# Patient Record
Sex: Female | Born: 1965 | Race: White | Hispanic: No | Smoking: Current every day smoker
Health system: Southern US, Community
[De-identification: ages and names within clinical notes are randomized; demographics above are authoritative.]

## PROBLEM LIST (undated history)

## (undated) DIAGNOSIS — I10 Essential (primary) hypertension: Secondary | ICD-10-CM

## (undated) DIAGNOSIS — K219 Gastro-esophageal reflux disease without esophagitis: Secondary | ICD-10-CM

## (undated) DIAGNOSIS — M1990 Unspecified osteoarthritis, unspecified site: Secondary | ICD-10-CM

## (undated) HISTORY — PX: BRAIN SURGERY: SHX531

## (undated) HISTORY — DX: Unspecified osteoarthritis, unspecified site: M19.90

---

## 2002-10-14 DIAGNOSIS — I10 Essential (primary) hypertension: Secondary | ICD-10-CM | POA: Insufficient documentation

## 2008-04-12 ENCOUNTER — Emergency Department: Payer: Self-pay | Admitting: Unknown Physician Specialty

## 2008-08-25 ENCOUNTER — Emergency Department: Payer: Self-pay | Admitting: Unknown Physician Specialty

## 2009-01-30 ENCOUNTER — Emergency Department: Payer: Self-pay | Admitting: Emergency Medicine

## 2009-04-03 ENCOUNTER — Emergency Department: Payer: Self-pay | Admitting: Emergency Medicine

## 2009-05-21 ENCOUNTER — Emergency Department: Payer: Self-pay | Admitting: Emergency Medicine

## 2010-01-18 ENCOUNTER — Emergency Department: Payer: Self-pay | Admitting: Emergency Medicine

## 2010-03-09 ENCOUNTER — Emergency Department: Payer: Self-pay | Admitting: Emergency Medicine

## 2012-07-01 ENCOUNTER — Emergency Department: Payer: Self-pay | Admitting: Emergency Medicine

## 2012-07-08 ENCOUNTER — Emergency Department: Payer: Self-pay | Admitting: Emergency Medicine

## 2012-10-08 ENCOUNTER — Emergency Department: Payer: Self-pay | Admitting: Emergency Medicine

## 2012-10-08 LAB — CBC
HCT: 34.8 % — ABNORMAL LOW (ref 35.0–47.0)
HGB: 11.3 g/dL — ABNORMAL LOW (ref 12.0–16.0)
MCH: 25.5 pg — ABNORMAL LOW (ref 26.0–34.0)
MCHC: 32.6 g/dL (ref 32.0–36.0)
RDW: 17.3 % — ABNORMAL HIGH (ref 11.5–14.5)

## 2012-10-08 LAB — COMPREHENSIVE METABOLIC PANEL
Albumin: 3.3 g/dL — ABNORMAL LOW (ref 3.4–5.0)
Alkaline Phosphatase: 105 U/L (ref 50–136)
Anion Gap: 6 — ABNORMAL LOW (ref 7–16)
BUN: 6 mg/dL — ABNORMAL LOW (ref 7–18)
Calcium, Total: 8.4 mg/dL — ABNORMAL LOW (ref 8.5–10.1)
Chloride: 106 mmol/L (ref 98–107)
Creatinine: 0.58 mg/dL — ABNORMAL LOW (ref 0.60–1.30)
Glucose: 95 mg/dL (ref 65–99)
Osmolality: 275 (ref 275–301)
Total Protein: 7.3 g/dL (ref 6.4–8.2)

## 2012-10-08 LAB — URINALYSIS, COMPLETE
Blood: NEGATIVE
Glucose,UR: NEGATIVE mg/dL (ref 0–75)
Ketone: NEGATIVE
Leukocyte Esterase: NEGATIVE
Nitrite: NEGATIVE
Ph: 8 (ref 4.5–8.0)
Protein: NEGATIVE
RBC,UR: NONE SEEN /HPF (ref 0–5)
Specific Gravity: 1.005 (ref 1.003–1.030)
WBC UR: NONE SEEN /HPF (ref 0–5)

## 2012-10-08 LAB — LIPASE, BLOOD: Lipase: 99 U/L (ref 73–393)

## 2013-02-02 ENCOUNTER — Emergency Department: Payer: Self-pay | Admitting: Emergency Medicine

## 2013-02-02 LAB — BASIC METABOLIC PANEL
Anion Gap: 4 — ABNORMAL LOW (ref 7–16)
BUN: 7 mg/dL (ref 7–18)
Co2: 28 mmol/L (ref 21–32)
Creatinine: 0.52 mg/dL — ABNORMAL LOW (ref 0.60–1.30)
Glucose: 94 mg/dL (ref 65–99)
Potassium: 3.2 mmol/L — ABNORMAL LOW (ref 3.5–5.1)
Sodium: 136 mmol/L (ref 136–145)

## 2013-02-02 LAB — CBC
HCT: 32.9 % — ABNORMAL LOW (ref 35.0–47.0)
MCH: 24.5 pg — ABNORMAL LOW (ref 26.0–34.0)
Platelet: 252 10*3/uL (ref 150–440)
RBC: 4.38 10*6/uL (ref 3.80–5.20)
WBC: 7.1 10*3/uL (ref 3.6–11.0)

## 2013-02-02 LAB — TROPONIN I: Troponin-I: <0.02 ng/mL

## 2014-01-27 ENCOUNTER — Emergency Department: Payer: Self-pay | Admitting: Emergency Medicine

## 2014-03-19 ENCOUNTER — Emergency Department: Payer: Self-pay | Admitting: Emergency Medicine

## 2015-03-12 ENCOUNTER — Emergency Department: Payer: BLUE CROSS/BLUE SHIELD

## 2015-03-12 ENCOUNTER — Emergency Department
Admission: EM | Admit: 2015-03-12 | Discharge: 2015-03-13 | Disposition: A | Discharge status: Home / Self Care | Payer: BLUE CROSS/BLUE SHIELD | Attending: Emergency Medicine | Admitting: Emergency Medicine

## 2015-03-12 ENCOUNTER — Encounter: Payer: Self-pay | Admitting: Radiology

## 2015-03-12 DIAGNOSIS — R1030 Lower abdominal pain, unspecified: Secondary | ICD-10-CM

## 2015-03-12 DIAGNOSIS — K5732 Diverticulitis of large intestine without perforation or abscess without bleeding: Secondary | ICD-10-CM | POA: Insufficient documentation

## 2015-03-12 DIAGNOSIS — Z880 Allergy status to penicillin: Secondary | ICD-10-CM | POA: Insufficient documentation

## 2015-03-12 DIAGNOSIS — N390 Urinary tract infection, site not specified: Secondary | ICD-10-CM | POA: Insufficient documentation

## 2015-03-12 DIAGNOSIS — Z3202 Encounter for pregnancy test, result negative: Secondary | ICD-10-CM | POA: Insufficient documentation

## 2015-03-12 HISTORY — DX: Essential (primary) hypertension: I10

## 2015-03-12 LAB — COMPREHENSIVE METABOLIC PANEL
ALBUMIN: 3.8 g/dL (ref 3.5–5.0)
ALT: 22 U/L (ref 14–54)
AST: 33 U/L (ref 15–41)
Alkaline Phosphatase: 109 U/L (ref 38–126)
Anion gap: 8 (ref 5–15)
BILIRUBIN TOTAL: 0.8 mg/dL (ref 0.3–1.2)
BUN: 5 mg/dL — AB (ref 6–20)
CO2: 26 mmol/L (ref 22–32)
CREATININE: 0.61 mg/dL (ref 0.44–1.00)
Calcium: 8.6 mg/dL — ABNORMAL LOW (ref 8.9–10.3)
Chloride: 104 mmol/L (ref 101–111)
GFR calc Af Amer: >60 mL/min (ref >60)
Glucose, Bld: 99 mg/dL (ref 65–99)
Potassium: 3.2 mmol/L — ABNORMAL LOW (ref 3.5–5.1)
Sodium: 138 mmol/L (ref 135–145)
TOTAL PROTEIN: 7.4 g/dL (ref 6.5–8.1)

## 2015-03-12 LAB — CBC
HCT: 39.6 % (ref 35.0–47.0)
Hemoglobin: 12.9 g/dL (ref 12.0–16.0)
MCH: 26.9 pg (ref 26.0–34.0)
MCHC: 32.6 g/dL (ref 32.0–36.0)
MCV: 82.6 fL (ref 80.0–100.0)
PLATELETS: 288 10*3/uL (ref 150–440)
RBC: 4.79 MIL/uL (ref 3.80–5.20)
RDW: 15.5 % — AB (ref 11.5–14.5)
WBC: 16.2 10*3/uL — AB (ref 3.6–11.0)

## 2015-03-12 LAB — URINALYSIS COMPLETE WITH MICROSCOPIC (ARMC ONLY)
BILIRUBIN URINE: NEGATIVE
GLUCOSE, UA: NEGATIVE mg/dL
Nitrite: NEGATIVE
Protein, ur: 30 mg/dL — AB
Specific Gravity, Urine: 1.015 (ref 1.005–1.030)
pH: 6 (ref 5.0–8.0)

## 2015-03-12 LAB — LIPASE, BLOOD: Lipase: 17 U/L — ABNORMAL LOW (ref 22–51)

## 2015-03-12 LAB — PREGNANCY, URINE: PREG TEST UR: NEGATIVE

## 2015-03-12 MED ORDER — METRONIDAZOLE 500 MG PO TABS
500.0000 mg | ORAL_TABLET | Freq: Once | ORAL | Status: AC
  Administered 2015-03-13: 500 mg via ORAL
  Filled 2015-03-12: qty 1

## 2015-03-12 MED ORDER — ONDANSETRON HCL 4 MG/2ML IJ SOLN
4.0000 mg | Freq: Once | INTRAMUSCULAR | Status: AC
  Administered 2015-03-12: 4 mg via INTRAVENOUS
  Filled 2015-03-12: qty 2

## 2015-03-12 MED ORDER — MORPHINE SULFATE (PF) 4 MG/ML IV SOLN
4.0000 mg | Freq: Once | INTRAVENOUS | Status: AC
  Administered 2015-03-12: 4 mg via INTRAVENOUS
  Filled 2015-03-12: qty 1

## 2015-03-12 MED ORDER — IOHEXOL 240 MG/ML SOLN
25.0000 mL | Freq: Once | INTRAMUSCULAR | Status: AC | PRN
  Administered 2015-03-12: 25 mL via ORAL

## 2015-03-12 MED ORDER — CIPROFLOXACIN HCL 500 MG PO TABS: 500.0000 mg | ORAL_TABLET | Freq: Two times a day (BID) | ORAL | Status: AC

## 2015-03-12 MED ORDER — DEXTROSE 5 % IV SOLN
1.0000 g | Freq: Once | INTRAVENOUS | Status: AC
  Administered 2015-03-13: 1 g via INTRAVENOUS
  Filled 2015-03-12: qty 10

## 2015-03-12 MED ORDER — TRAMADOL HCL 50 MG PO TABS: 50.0000 mg | ORAL_TABLET | Freq: Four times a day (QID) | ORAL | Status: DC | PRN

## 2015-03-12 MED ORDER — CEPHALEXIN 500 MG PO CAPS: 500.0000 mg | ORAL_CAPSULE | Freq: Three times a day (TID) | ORAL | Status: DC

## 2015-03-12 MED ORDER — IOHEXOL 300 MG/ML  SOLN
100.0000 mL | Freq: Once | INTRAMUSCULAR | Status: AC | PRN
  Administered 2015-03-12: 100 mL via INTRAVENOUS

## 2015-03-12 MED ORDER — METRONIDAZOLE 500 MG PO TABS: 500.0000 mg | ORAL_TABLET | Freq: Three times a day (TID) | ORAL | Status: AC

## 2015-03-12 MED ORDER — SODIUM CHLORIDE 0.9 % IV BOLUS (SEPSIS)
1000.0000 mL | Freq: Once | INTRAVENOUS | Status: AC
  Administered 2015-03-12: 1000 mL via INTRAVENOUS

## 2015-03-12 MED ORDER — CIPROFLOXACIN HCL 500 MG PO TABS
500.0000 mg | ORAL_TABLET | Freq: Once | ORAL | Status: AC
  Administered 2015-03-13: 500 mg via ORAL
  Filled 2015-03-12: qty 1

## 2015-03-12 NOTE — ED Provider Notes (Addendum)
[facility] Emergency Department Provider Note  Time seen: 10:19 PM  I have reviewed the triage vital signs and the nursing notes.   HISTORY  Chief Complaint Abdominal Pain and Back Pain    HPI Sherry Wright is a 49 y.o. female who presents to the emergency department with lower abdominal pain since last night. According to the patient since around 11 PM last night she developed left lower quadrant abdominal pain radiating to her back. Describes the pain as moderate to severe in intensity. Denies diarrhea or constipation. Denies nausea or vomiting. Denies fever. Patient states the pain had her crippled overt tonight due to severity so she came to the emergency department for evaluation. Denies any similar pains in the past.No noted modifying factors identified per patient.     No past medical history on file.  There are no active problems to display for this patient.   No past surgical history on file.  No current outpatient prescriptions on file.  Allergies Penicillins  No family history on file.  Social History Social History  Substance Use Topics  . Smoking status: Not on file  . Smokeless tobacco: Not on file  . Alcohol Use: Not on file    Review of Systems Constitutional: Negative for fever. Cardiovascular: Negative for chest pain. Respiratory: Negative for shortness of breath. Gastrointestinal: Positive lower and left lower quadrant abdominal pain. Genitourinary: Negative for dysuria. Negative for vaginal discharge. Currently on her period. Musculoskeletal: He eats the pain radiates to her left lower back. Neurological: Negative for headache 10-point ROS otherwise negative.  ____________________________________________   PHYSICAL EXAM:  VITAL SIGNS: ED Triage Vitals  Enc Vitals Group     BP 03/12/15 1946 180/100 mmHg     Pulse Rate 03/12/15 1946 94     Resp 03/12/15 1946 18     Temp 03/12/15 1946 99.2 F (37.3 C)     Temp  Source 03/12/15 1946 Oral     SpO2 03/12/15 1946 97 %     Weight 03/12/15 1946 170 lb (77.111 kg)     Height 03/12/15 1946 5\' 6"  (1.676 m)     Head Cir --      Peak Flow --      Pain Score 03/12/15 1947 9     Pain Loc --      Pain Edu? --      Excl. in GC? --     Constitutional: Alert and oriented. Well appearing and in no distress. Eyes: Normal exam ENT   Head: Normocephalic and atraumatic   Mouth/Throat: Mucous membranes are moist. Cardiovascular: Normal rate, regular rhythm. Respiratory: Normal respiratory effort without tachypnea nor retractions. Breath sounds are clear Gastrointestinal: Soft, moderate lower abdominal and left lower quadrant tenderness to palpation. No right-sided tenderness. No rebound or guarding. No distention. No back tenderness identified. Musculoskeletal: Nontender with normal range of motion in all extremities. Neurologic:  Normal speech and language. No gross focal neurologic deficits Psychiatric: Mood and affect are normal. Speech and behavior are normal. ____________________________________________   RADIOLOGY  CT pending  ____________________________________________   INITIAL IMPRESSION / ASSESSMENT AND PLAN / ED COURSE  Pertinent labs & imaging results that were available during my care of the patient were reviewed by me and considered in my medical decision making (see chart for details).  Patient with left lower quadrant abdominal pain since last night. Vitals show temperature 99.2, mild hypertension otherwise within normal limits. Labs show a leukocytosis of 16 otherwise within normal limits.  Patient is mild to moderate left lower quadrant tenderness to palpation. Given these findings with her leukocytosis we will proceed with a CT abdomen and pelvis to help further evaluate the patient's pain. We'll treat the pain, give nausea medication, and IV hydrate. Discussed the plan of care with the patient was agreeable.  Urinalysis is  resulted showing too numerous to count white blood cells, most consistent urinary tract infection, given the degree of left lower quadrant abdominal tenderness we will still proceed with CT scan, but will begin IV antibiotics for urinary tract infection, which could likely be the source of the patient's discomfort.   CT scan results showing mild diverticulitis. Discussed with the patient, will discharge on ciprofloxacin and Flagyl, and have the patient follow with GI medicine to discuss follow-up colonoscopy.  ____________________________________________   FINAL CLINICAL IMPRESSION(S) / ED DIAGNOSES  Left lower quadrant abdominal pain UTI Diverticulitis Minna Antis, MD 03/12/15 2320  Minna Antis, MD 03/12/15 2351

## 2015-03-12 NOTE — ED Notes (Signed)
Patient reports abdominal and back pain since last pm.  +nausea, no vomiting.

## 2015-03-12 NOTE — Discharge Instructions (Signed)
You have been seen in the emergency department today for abdominal pain your results show a urinary tract infection. Please take your course of antibiotics as written. Please take your pain medication as needed, as prescribed. Please follow-up with your primary care physician as soon as possible. Return to the emergency department for any worsening pain, fever, or any other symptom personally concerning to your self.    Abdominal Pain, Women Abdominal (stomach, pelvic, or belly) pain can be caused by many things. It is important to tell your doctor:  The location of the pain.  Does it come and go or is it present all the time?  Are there things that start the pain (eating certain foods, exercise)?  Are there other symptoms associated with the pain (fever, nausea, vomiting, diarrhea)? All of this is helpful to know when trying to find the cause of the pain. CAUSES   Stomach: virus or bacteria infection, or ulcer.  Intestine: appendicitis (inflamed appendix), regional ileitis (Crohn's disease), ulcerative colitis (inflamed colon), irritable bowel syndrome, diverticulitis (inflamed diverticulum of the colon), or cancer of the stomach or intestine.  Gallbladder disease or stones in the gallbladder.  Kidney disease, kidney stones, or infection.  Pancreas infection or cancer.  Fibromyalgia (pain disorder).  Diseases of the female organs:  Uterus: fibroid (non-cancerous) tumors or infection.  Fallopian tubes: infection or tubal pregnancy.  Ovary: cysts or tumors.  Pelvic adhesions (scar tissue).  Endometriosis (uterus lining tissue growing in the pelvis and on the pelvic organs).  Pelvic congestion syndrome (female organs filling up with blood just before the menstrual period).  Pain with the menstrual period.  Pain with ovulation (producing an egg).  Pain with an IUD (intrauterine device, birth control) in the uterus.  Cancer of the female organs.  Functional pain (pain  not caused by a disease, may improve without treatment).  Psychological pain.  Depression. DIAGNOSIS  Your doctor will decide the seriousness of your pain by doing an examination.  Blood tests.  X-rays.  Ultrasound.  CT scan (computed tomography, special type of X-ray).  MRI (magnetic resonance imaging).  Cultures, for infection.  Barium enema (dye inserted in the large intestine, to better view it with X-rays).  Colonoscopy (looking in intestine with a lighted tube).  Laparoscopy (minor surgery, looking in abdomen with a lighted tube).  Major abdominal exploratory surgery (looking in abdomen with a large incision). TREATMENT  The treatment will depend on the cause of the pain.   Many cases can be observed and treated at home.  Over-the-counter medicines recommended by your caregiver.  Prescription medicine.  Antibiotics, for infection.  Birth control pills, for painful periods or for ovulation pain.  Hormone treatment, for endometriosis.  Nerve blocking injections.  Physical therapy.  Antidepressants.  Counseling with a psychologist or psychiatrist.  Minor or major surgery. HOME CARE INSTRUCTIONS   Do not take laxatives, unless directed by your caregiver.  Take over-the-counter pain medicine only if ordered by your caregiver. Do not take aspirin because it can cause an upset stomach or bleeding.  Try a clear liquid diet (broth or water) as ordered by your caregiver. Slowly move to a bland diet, as tolerated, if the pain is related to the stomach or intestine.  Have a thermometer and take your temperature several times a day, and record it.  Bed rest and sleep, if it helps the pain.  Avoid sexual intercourse, if it causes pain.  Avoid stressful situations.  Keep your follow-up appointments and tests, as your  caregiver orders.  If the pain does not go away with medicine or surgery, you may try:  Acupuncture.  Relaxation exercises (yoga,  meditation).  Group therapy.  Counseling. SEEK MEDICAL CARE IF:   You notice certain foods cause stomach pain.  Your home care treatment is not helping your pain.  You need stronger pain medicine.  You want your IUD removed.  You feel faint or lightheaded.  You develop nausea and vomiting.  You develop a rash.  You are having side effects or an allergy to your medicine. SEEK IMMEDIATE MEDICAL CARE IF:   Your pain does not go away or gets worse.  You have a fever.  Your pain is felt only in portions of the abdomen. The right side could possibly be appendicitis. The left lower portion of the abdomen could be colitis or diverticulitis.  You are passing blood in your stools (bright red or black tarry stools, with or without vomiting).  You have blood in your urine.  You develop chills, with or without a fever.  You pass out. MAKE SURE YOU:   Understand these instructions.  Will watch your condition.  Will get help right away if you are not doing well or get worse. Document Released: 03/25/2007 Document Revised: 10/12/2013 Document Reviewed: 04/14/2009 [facility] Patient Information 2015 [place], [place]. This information is not intended to replace advice given to you by your health care provider. Make sure you discuss any questions you have with your health care provider.  Urinary Tract Infection A urinary tract infection (UTI) can occur any place along the urinary tract. The tract includes the kidneys, ureters, bladder, and urethra. A type of germ called bacteria often causes a UTI. UTIs are often helped with antibiotic medicine.  HOME CARE   If given, take antibiotics as told by your doctor. Finish them even if you start to feel better.  Drink enough fluids to keep your pee (urine) clear or pale yellow.  Avoid tea, drinks with caffeine, and bubbly (carbonated) drinks.  Pee often. Avoid holding your pee in for a long time.  Pee before and after having sex  (intercourse).  Wipe from front to back after you poop (bowel movement) if you are a woman. Use each tissue only once. GET HELP RIGHT AWAY IF:   You have back pain.  You have lower belly (abdominal) pain.  You have chills.  You feel sick to your stomach (nauseous).  You throw up (vomit).  Your burning or discomfort with peeing does not go away.  You have a fever.  Your symptoms are not better in 3 days. MAKE SURE YOU:   Understand these instructions.  Will watch your condition.  Will get help right away if you are not doing well or get worse. Document Released: 11/14/2007 Document Revised: 02/20/2012 Document Reviewed: 12/27/2011 [facility] Patient Information 2015 [place], [place]. This information is not intended to replace advice given to you by your health care provider. Make sure you discuss any questions you have with your health care provider.  Diverticulitis Diverticulitis is inflammation or infection of small pouches in your colon that form when you have a condition called diverticulosis. The pouches in your colon are called diverticula. Your colon, or large intestine, is where water is absorbed and stool is formed. Complications of diverticulitis can include:  Bleeding.  Severe infection.  Severe pain.  Perforation of your colon.  Obstruction of your colon. CAUSES  Diverticulitis is caused by bacteria. Diverticulitis happens when stool becomes trapped in diverticula.  This allows bacteria to grow in the diverticula, which can lead to inflammation and infection. RISK FACTORS People with diverticulosis are at risk for diverticulitis. Eating a diet that does not include enough fiber from fruits and vegetables may make diverticulitis more likely to develop. SYMPTOMS  Symptoms of diverticulitis may include:  Abdominal pain and tenderness. The pain is normally located on the left side of the abdomen, but may occur in other areas.  Fever and  chills.  Bloating.  Cramping.  Nausea.  Vomiting.  Constipation.  Diarrhea.  Blood in your stool. DIAGNOSIS  Your health care provider will ask you about your medical history and do a physical exam. You may need to have tests done because many medical conditions can cause the same symptoms as diverticulitis. Tests may include:  Blood tests.  Urine tests.  Imaging tests of the abdomen, including X-rays and CT scans. When your condition is under control, your health care provider may recommend that you have a colonoscopy. A colonoscopy can show how severe your diverticula are and whether something else is causing your symptoms. TREATMENT  Most cases of diverticulitis are mild and can be treated at home. Treatment may include:  Taking over-the-counter pain medicines.  Following a clear liquid diet.  Taking antibiotic medicines by mouth for 7-10 days. More severe cases may be treated at a hospital. Treatment may include:  Not eating or drinking.  Taking prescription pain medicine.  Receiving antibiotic medicines through an IV tube.  Receiving fluids and nutrition through an IV tube.  Surgery. HOME CARE INSTRUCTIONS   Follow your health care provider's instructions carefully.  Follow a full liquid diet or other diet as directed by your health care provider. After your symptoms improve, your health care provider may tell you to change your diet. He or she may recommend you eat a high-fiber diet. Fruits and vegetables are good sources of fiber. Fiber makes it easier to pass stool.  Take fiber supplements or probiotics as directed by your health care provider.  Only take medicines as directed by your health care provider.  Keep all your follow-up appointments. SEEK MEDICAL CARE IF:   Your pain does not improve.  You have a hard time eating food.  Your bowel movements do not return to normal. SEEK IMMEDIATE MEDICAL CARE IF:   Your pain becomes worse.  Your  symptoms do not get better.  Your symptoms suddenly get worse.  You have a fever.  You have repeated vomiting.  You have bloody or black, tarry stools. MAKE SURE YOU:   Understand these instructions.  Will watch your condition.  Will get help right away if you are not doing well or get worse. Document Released: 03/07/2005 Document Revised: 06/02/2013 Document Reviewed: 04/22/2013 [facility] Patient Information 2015 [place], [place]. This information is not intended to replace advice given to you by your health care provider. Make sure you discuss any questions you have with your health care provider.

## 2015-03-13 MED ORDER — MORPHINE SULFATE (PF) 4 MG/ML IV SOLN
4.0000 mg | Freq: Once | INTRAVENOUS | Status: AC
  Administered 2015-03-13: 4 mg via INTRAVENOUS
  Filled 2015-03-13: qty 1

## 2015-05-25 ENCOUNTER — Emergency Department
Admission: EM | Admit: 2015-05-25 | Discharge: 2015-05-25 | Disposition: A | Discharge status: Home / Self Care | Payer: Self-pay | Attending: Emergency Medicine | Admitting: Emergency Medicine

## 2015-05-25 ENCOUNTER — Emergency Department: Payer: Self-pay

## 2015-05-25 DIAGNOSIS — F17210 Nicotine dependence, cigarettes, uncomplicated: Secondary | ICD-10-CM | POA: Insufficient documentation

## 2015-05-25 DIAGNOSIS — Z79899 Other long term (current) drug therapy: Secondary | ICD-10-CM | POA: Insufficient documentation

## 2015-05-25 DIAGNOSIS — I119 Hypertensive heart disease without heart failure: Secondary | ICD-10-CM | POA: Insufficient documentation

## 2015-05-25 DIAGNOSIS — Z880 Allergy status to penicillin: Secondary | ICD-10-CM | POA: Insufficient documentation

## 2015-05-25 DIAGNOSIS — I10 Essential (primary) hypertension: Secondary | ICD-10-CM

## 2015-05-25 DIAGNOSIS — F419 Anxiety disorder, unspecified: Secondary | ICD-10-CM | POA: Insufficient documentation

## 2015-05-25 DIAGNOSIS — R110 Nausea: Secondary | ICD-10-CM | POA: Insufficient documentation

## 2015-05-25 DIAGNOSIS — H538 Other visual disturbances: Secondary | ICD-10-CM | POA: Insufficient documentation

## 2015-05-25 LAB — COMPREHENSIVE METABOLIC PANEL
ALT: 12 U/L — ABNORMAL LOW (ref 14–54)
ANION GAP: 6 (ref 5–15)
AST: 18 U/L (ref 15–41)
Albumin: 3.5 g/dL (ref 3.5–5.0)
Alkaline Phosphatase: 78 U/L (ref 38–126)
BUN: 8 mg/dL (ref 6–20)
CHLORIDE: 106 mmol/L (ref 101–111)
CO2: 25 mmol/L (ref 22–32)
Calcium: 8.6 mg/dL — ABNORMAL LOW (ref 8.9–10.3)
Creatinine, Ser: 0.43 mg/dL — ABNORMAL LOW (ref 0.44–1.00)
Glucose, Bld: 89 mg/dL (ref 65–99)
POTASSIUM: 3.7 mmol/L (ref 3.5–5.1)
Sodium: 137 mmol/L (ref 135–145)
TOTAL PROTEIN: 7 g/dL (ref 6.5–8.1)
Total Bilirubin: 0.5 mg/dL (ref 0.3–1.2)

## 2015-05-25 LAB — URINALYSIS COMPLETE WITH MICROSCOPIC (ARMC ONLY)
BILIRUBIN URINE: NEGATIVE
Bacteria, UA: NONE SEEN
Glucose, UA: NEGATIVE mg/dL
HGB URINE DIPSTICK: NEGATIVE
KETONES UR: NEGATIVE mg/dL
LEUKOCYTES UA: NEGATIVE
NITRITE: NEGATIVE
Protein, ur: NEGATIVE mg/dL
RBC / HPF: NONE SEEN RBC/hpf (ref 0–5)
SPECIFIC GRAVITY, URINE: 1.001 — AB (ref 1.005–1.030)
pH: 6 (ref 5.0–8.0)

## 2015-05-25 LAB — CBC
HCT: 37.3 % (ref 35.0–47.0)
Hemoglobin: 12.1 g/dL (ref 12.0–16.0)
MCH: 26.7 pg (ref 26.0–34.0)
MCHC: 32.3 g/dL (ref 32.0–36.0)
MCV: 82.8 fL (ref 80.0–100.0)
PLATELETS: 279 10*3/uL (ref 150–440)
RBC: 4.51 MIL/uL (ref 3.80–5.20)
RDW: 16.9 % — AB (ref 11.5–14.5)
WBC: 7.9 10*3/uL (ref 3.6–11.0)

## 2015-05-25 LAB — TROPONIN I

## 2015-05-25 MED ORDER — METOPROLOL TARTRATE 25 MG PO TABS
25.0000 mg | ORAL_TABLET | Freq: Once | ORAL | Status: AC
  Administered 2015-05-25: 25 mg via ORAL
  Filled 2015-05-25: qty 1

## 2015-05-25 MED ORDER — HYDROCHLOROTHIAZIDE 25 MG PO TABS: 25.0000 mg | ORAL_TABLET | Freq: Every day | ORAL | Status: DC

## 2015-05-25 MED ORDER — METOPROLOL TARTRATE 25 MG PO TABS: 25.0000 mg | ORAL_TABLET | Freq: Every day | ORAL | Status: DC

## 2015-05-25 NOTE — Discharge Instructions (Signed)
Please make an appointment with your primary care doctor at the [facility] clinic for further evaluation of your high blood pressure. Please take both the blood pressure medications and record your blood pressure each morning approximately 1-2 hours after taking the medications. Please bring the record with you to your primary care doctor's office. Please also record any signs or symptoms that you have throughout the day.   Please return to the emergency department if you develop severe headache, numbness tingling or weakness, chest pain, shortness breath, fainting, or any other symptoms concerning to you.

## 2015-05-25 NOTE — ED Notes (Signed)
Pt given information about medical manegement and RX filling

## 2015-05-25 NOTE — ED Notes (Signed)
Pt c/o neck and head pain with dizziness for the past couple days, pt is hypertensive in triage and states she has a hx of HTN but does not take medication because she does not have insurance

## 2015-05-25 NOTE — ED Provider Notes (Signed)
[facility] Emergency Department Provider Note  ____________________________________________  Time seen: Approximately 8:14 AM  I have reviewed the triage vital signs and the nursing notes.   HISTORY  Chief Complaint Neck Pain and Dizziness    HPI Sherry Wright is a 49 y.o. female with a history of untreated hypertension due to medication noncompliance presenting with 3 days of nausea, "a nervous feeling," and visual changes. She states that she was previously on 2 antihypertensives which she has not taken due to insurance complications. For the past 3 days, she has woken up every day with blurred vision and seeing black spots, a nervous feeling from head to toe, and nausea without vomiting. Usually the symptoms resolve with eating oatmeal. Today, she did not improve so she came to the emergency department for further evaluation. She has had some mild headaches, and upper neck discomfort without trauma. She denies any chest pain, chest tightness, or chest pressure. She has had chronic "indigestion," which is unchanged.   Past Medical History  Diagnosis Date  . Hypertension     There are no active problems to display for this patient.   Past Surgical History  Procedure Laterality Date  . Brain surgery      Current Outpatient Rx  Name  Route  Sig  Dispense  Refill  . acetaminophen (TYLENOL) 500 MG tablet   Oral   Take 500 mg by mouth every 6 (six) hours as needed.         . hydrochlorothiazide (HYDRODIURIL) 25 MG tablet   Oral   Take 1 tablet (25 mg total) by mouth daily.   30 tablet   0   . metoprolol tartrate (LOPRESSOR) 25 MG tablet   Oral   Take 1 tablet (25 mg total) by mouth daily.   30 tablet   0   . traMADol (ULTRAM) 50 MG tablet   Oral   Take 1 tablet (50 mg total) by mouth every 6 (six) hours as needed.   20 tablet   0     Allergies Penicillins  No family history on file.  Social History Social History  Substance Use  Topics  . Smoking status: Current Every Day Smoker    Types: Cigarettes  . Smokeless tobacco: None  . Alcohol Use: No    Review of Systems Constitutional: No fever/chills. No lightheadedness or syncope. Eyes: Positive intermittent blurred vision with occasional black spots. No eye discharge. ENT: No sore throat. Cardiovascular: Denies chest pain, pressure, tightness. He does endorse palpitations associated with her nervous feeling. Respiratory: Denies shortness of breath.  No cough. Gastrointestinal: No abdominal pain.  Positive nausea, no vomiting.  No diarrhea.  No constipation. Genitourinary: Negative for dysuria. Musculoskeletal: Negative for back pain. Skin: Negative for rash. Neurological: Negative for headaches, focal weakness or numbness. Psychiatric:Sensation of anxiety.  10-point ROS otherwise negative.  ____________________________________________   PHYSICAL EXAM:  VITAL SIGNS: ED Triage Vitals  Enc Vitals Group     BP 05/25/15 0747 231/104 mmHg     Pulse Rate 05/25/15 0747 81     Resp 05/25/15 0747 18     Temp 05/25/15 0750 97.5 F (36.4 C)     Temp Source 05/25/15 0750 Oral     SpO2 05/25/15 0747 97 %     Weight 05/25/15 0747 170 lb (77.111 kg)     Height 05/25/15 0747 5\' 6"  (1.676 m)     Head Cir --      Peak Flow --  Pain Score 05/25/15 0750 7     Pain Loc --      Pain Edu? --      Excl. in GC? --     Constitutional: Alert and oriented. Well appearing and in no acute distress. Answer question appropriately. Eyes: Conjunctivae are normal.  EOMI. no scleral icterus. Head: Atraumatic. Nose: No congestion/rhinnorhea. Mouth/Throat: Mucous membranes are moist.  Neck: No stridor.  Supple.  JVD. Cardiovascular: Normal rate, regular rhythm. Holosystolic murmur without rubs or gallops.  Respiratory: Normal respiratory effort.  No retractions. Lungs CTAB.  No wheezes, rales or ronchi. Gastrointestinal: Soft and nontender. No distention. No peritoneal  signs. Musculoskeletal: No LE edema. No palpable cords or tenderness to palpation in the calves. No Homans sign. Neurologic: Alert and oriented 3. Speech is clear. Naming and repetition are intact. Face and smile symmetric. No pronator drift. 5 out of 5 grip, biceps, triceps, hip flexors, plantar flexion and dorsiflexion. Normal sensation to light touch in the bilateral upper and lower extremities, and face. Normal heel-to-shin. Skin:  Skin is warm, dry and intact. No rash noted. Psychiatric: Mood and affect are normal. Speech and behavior are normal.  Normal judgement.  ____________________________________________   LABS (all labs ordered are listed, but only abnormal results are displayed)  Labs Reviewed  CBC - Abnormal; Notable for the following:    RDW 16.9 (*)    All other components within normal limits  COMPREHENSIVE METABOLIC PANEL - Abnormal; Notable for the following:    Creatinine, Ser 0.43 (*)    Calcium 8.6 (*)    ALT 12 (*)    All other components within normal limits  URINALYSIS COMPLETEWITH MICROSCOPIC ([facility] ONLY) - Abnormal; Notable for the following:    Color, Urine COLORLESS (*)    APPearance CLEAR (*)    Specific Gravity, Urine 1.001 (*)    Squamous Epithelial / LPF 0-5 (*)    All other components within normal limits  TROPONIN I   ____________________________________________  EKG  ED ECG REPORT I, Rockne Menghini, the attending physician, personally viewed and interpreted this ECG.   Date: 05/25/2015  EKG Time: 802  Rate: 74  Rhythm: normal sinus rhythm  Axis: Normal  Intervals:first-degree A-V block   ST&T Change: No ST elevation.  ____________________________________________  RADIOLOGY  Dg Chest 2 View  05/25/2015  CLINICAL DATA:  49 year old female with dizziness, hypertension, headache, visual changes. Smoker. Initial encounter. EXAM: CHEST  2 VIEW COMPARISON:  02/02/2013 and earlier. FINDINGS: Stable large lung volumes. Mild  cardiomegaly is stable since 2014. Other mediastinal contours are within normal limits. Visualized tracheal air column is within normal limits. No pneumothorax, pulmonary edema, pleural effusion or acute pulmonary opacity. Coarse bilateral interstitial markings are stable. No acute osseous abnormality identified. IMPRESSION: Chronic hyperinflation and mild cardiomegaly. No acute cardiopulmonary abnormality. Electronically Signed   By: Odessa Fleming M.D.   On: 05/25/2015 08:52    ____________________________________________   PROCEDURES  Procedure(s) performed: None  Critical Care performed: No ____________________________________________   INITIAL IMPRESSION / ASSESSMENT AND PLAN / ED COURSE  Pertinent labs & imaging results that were available during my care of the patient were reviewed by me and considered in my medical decision making (see chart for details).  49 y.o. female with untreated hypertension presenting with significant hypertension with a blood pressure 231/104 with associated nervous feeling, nausea, and visual changes. The patient has no focal findings on her neurologic exam, acute CVA is very unlikely. I will treat her blood pressure, as well  as get basic labs to evaluate for atypical ACS symptoms. She is likely having symptomatic hypertension. If the patient has a reassuring workup and is feeling better, and to speed discharge home with follow-up with her PMD at the [place] clinic.  ----------------------------------------- 9:13 AM on 05/25/2015 -----------------------------------------  The patient's symptoms have significantly improved and her blood pressure is 196/102. The patient's EKG does not show any ischemic changes, her creatinine is normal and she is not having any proteinuria, her electrolytes are normal, and her troponin is negative. She has mild cardiomegaly on her chest x-ray and I have told her about this and recommended outpatient follow-up echocardiogram for  further evaluation. She will make a follow-up appointment with her primary care doctor at the [facility] and I will discharge her home with HCTZ and metoprolol. She understands return precautions as well as follow-up instructions.  ____________________________________________  FINAL CLINICAL IMPRESSION(S) / ED DIAGNOSES  Final diagnoses:  Essential hypertension  Blurred vision  Nausea without vomiting  Cardiomegaly - hypertensive      NEW MEDICATIONS STARTED DURING THIS VISIT:  New Prescriptions   HYDROCHLOROTHIAZIDE (HYDRODIURIL) 25 MG TABLET    Take 1 tablet (25 mg total) by mouth daily.   METOPROLOL TARTRATE (LOPRESSOR) 25 MG TABLET    Take 1 tablet (25 mg total) by mouth daily.     Rockne Menghini, MD 05/25/15 [phone]

## 2015-05-25 NOTE — ED Notes (Signed)
MD at bedside. 

## 2015-05-25 NOTE — ED Notes (Signed)
Pt states she has felt "nervous" every morning since Monday but today the nerves felt worse and would not go away, also states black spots and indigestion, states hx of HTN and present neck/ upper back pain, denies any chest pain, nausea and vomiting or SOB, pt awake and alert

## 2015-08-02 ENCOUNTER — Emergency Department
Admission: EM | Admit: 2015-08-02 | Discharge: 2015-08-03 | Disposition: A | Discharge status: Home / Self Care | Payer: Worker's Compensation | Attending: Emergency Medicine | Admitting: Emergency Medicine

## 2015-08-02 DIAGNOSIS — S5011XA Contusion of right forearm, initial encounter: Secondary | ICD-10-CM | POA: Insufficient documentation

## 2015-08-02 DIAGNOSIS — S60221A Contusion of right hand, initial encounter: Secondary | ICD-10-CM | POA: Insufficient documentation

## 2015-08-02 DIAGNOSIS — Z880 Allergy status to penicillin: Secondary | ICD-10-CM | POA: Insufficient documentation

## 2015-08-02 DIAGNOSIS — Y9389 Activity, other specified: Secondary | ICD-10-CM | POA: Insufficient documentation

## 2015-08-02 DIAGNOSIS — S0083XA Contusion of other part of head, initial encounter: Secondary | ICD-10-CM | POA: Insufficient documentation

## 2015-08-02 DIAGNOSIS — I10 Essential (primary) hypertension: Secondary | ICD-10-CM | POA: Diagnosis not present

## 2015-08-02 DIAGNOSIS — F17210 Nicotine dependence, cigarettes, uncomplicated: Secondary | ICD-10-CM | POA: Insufficient documentation

## 2015-08-02 DIAGNOSIS — W01198A Fall on same level from slipping, tripping and stumbling with subsequent striking against other object, initial encounter: Secondary | ICD-10-CM | POA: Insufficient documentation

## 2015-08-02 DIAGNOSIS — S8002XA Contusion of left knee, initial encounter: Secondary | ICD-10-CM | POA: Insufficient documentation

## 2015-08-02 DIAGNOSIS — Z79899 Other long term (current) drug therapy: Secondary | ICD-10-CM | POA: Diagnosis not present

## 2015-08-02 DIAGNOSIS — Y990 Civilian activity done for income or pay: Secondary | ICD-10-CM | POA: Insufficient documentation

## 2015-08-02 DIAGNOSIS — S6991XA Unspecified injury of right wrist, hand and finger(s), initial encounter: Secondary | ICD-10-CM | POA: Diagnosis present

## 2015-08-02 DIAGNOSIS — Y9289 Other specified places as the place of occurrence of the external cause: Secondary | ICD-10-CM | POA: Diagnosis not present

## 2015-08-02 NOTE — ED Notes (Signed)
Pt arrived to ED with c/o right hand pain, bilateral knee pain after fall on Friday. Pt reports "slip on plastic and fell forward landing on both knees onto cement floor". + bruising right thumb and wrist noted. Pt c/o pain in bilateral knees, right wrist and thumb.

## 2015-08-02 NOTE — ED Notes (Signed)
Workers comp completed 

## 2015-08-02 NOTE — ED Notes (Signed)
Pt c/o right wrist and thumb pain from a mechanical fall at work. Pt denies hitting head. Pt A&O. No deformity noted

## 2015-08-02 NOTE — ED Provider Notes (Signed)
[facility] Emergency Department Provider Note  ____________________________________________  Time seen: Approximately 11:21 PM  I have reviewed the triage vital signs and the nursing notes.   HISTORY  Chief Complaint Hand Pain    HPI Sherry Wright is a 50 y.o. female, NAD, presents to the emergency department after falling at work today. States she slipped on plastic and fell forward. Landed on both knees and caught herself with her right hand. Also notes she hit her chin during the fall but denies laceration or open wounds.  Denies LOC, chest pain, shortness of breath. His bruising about the right hand and left knee. Tenderness about the right thumb and left knee. As been able to ambulate and has full range of motion of all joints.   Past Medical History  Diagnosis Date  . Hypertension     There are no active problems to display for this patient.   Past Surgical History  Procedure Laterality Date  . Brain surgery      Current Outpatient Rx  Name  Route  Sig  Dispense  Refill  . acetaminophen (TYLENOL) 500 MG tablet   Oral   Take 500 mg by mouth every 6 (six) hours as needed.         . hydrochlorothiazide (HYDRODIURIL) 25 MG tablet   Oral   Take 1 tablet (25 mg total) by mouth daily.   30 tablet   0   . metoprolol tartrate (LOPRESSOR) 25 MG tablet   Oral   Take 1 tablet (25 mg total) by mouth daily.   30 tablet   0   . traMADol (ULTRAM) 50 MG tablet   Oral   Take 1 tablet (50 mg total) by mouth every 6 (six) hours as needed.   20 tablet   0     Allergies Penicillins  History reviewed. No pertinent family history.  Social History Social History  Substance Use Topics  . Smoking status: Current Every Day Smoker    Types: Cigarettes  . Smokeless tobacco: None  . Alcohol Use: No     Review of Systems  Constitutional: No fever/chills Eyes: No visual changes. No discharge ENT: No loose teeth Cardiovascular: No chest  pain. Respiratory: No shortness of breath. No wheezing.  Gastrointestinal: No abdominal pain.  No nausea, vomiting.   Musculoskeletal: Positive for right hand, left knee pain. Negative for back pain.  Skin: Positive bruising right forearm, right hand and left knee area and Negative for rash, lacerations, open wounds. Neurological: Negative for headaches, focal weakness or numbness. No LOC   10-point ROS otherwise negative.  ____________________________________________   PHYSICAL EXAM:  VITAL SIGNS: ED Triage Vitals  Enc Vitals Group     BP 08/02/15 2116 220/108 mmHg     Pulse Rate 08/02/15 2116 75     Resp 08/02/15 2116 20     Temp 08/02/15 2116 98.5 F (36.9 C)     Temp Source 08/02/15 2116 Oral     SpO2 08/02/15 2116 97 %     Weight 08/02/15 2116 170 lb (77.111 kg)     Height 08/02/15 2116 5\' 6"  (1.676 m)     Head Cir --      Peak Flow --      Pain Score 08/02/15 2128 5     Pain Loc --      Pain Edu? --      Excl. in GC? --     Constitutional: Alert and oriented. Well appearing and in  no acute distress. Eyes: Conjunctivae are normal. PERRL. EOMI without pain.  Head: Atraumatic. Neck: No cervical spine tenderness to palpation. Supple with full range of motion. Hematological/Lymphatic/Immunilogical: No cervical lymphadenopathy. Cardiovascular: Normal rate, regular rhythm. Normal S1 and S2.  Good peripheral circulation. Respiratory: Normal respiratory effort without tachypnea or retractions. Lungs CTAB. Musculoskeletal: Tender to palpation over the left anterior knee. No laxity or effusion noted. Tender to palpation over the right base of the thumb but with full range of motion. No crepitus to manipulation of the joint.  Neurologic:  Normal speech and language. No gross focal neurologic deficits are appreciated.  Skin:  Yellow bruising about the anterior left knee, left lower chin. Erythematous, blue ecchymosis noted about the distal right forearm. Skin is warm, dry and  intact. No rash, lesions noted. Psychiatric: Mood and affect are normal. Speech and behavior are normal. Patient exhibits appropriate insight and judgement.   ____________________________________________   LABS  None  ____________________________________________  EKG  None ____________________________________________  RADIOLOGY I have personally viewed and evaluated these images (plain radiographs) as part of my medical decision making, as well as reviewing the written report by the radiologist.  Dg Knee Complete 4 Views Left  08/03/2015  CLINICAL DATA:  Acute onset of bilateral knee pain status post fall. Initial encounter. EXAM: LEFT KNEE - COMPLETE 4+ VIEW COMPARISON:  None. FINDINGS: There is no evidence of fracture or dislocation. The joint spaces are preserved. No significant degenerative change is seen; the patellofemoral joint is grossly unremarkable in appearance. No significant joint effusion is seen. The visualized soft tissues are normal in appearance. IMPRESSION: No evidence of fracture or dislocation. Electronically Signed   By: Roanna Raider M.D.   On: 08/03/2015 00:48   Dg Hand Complete Right  08/03/2015  CLINICAL DATA:  Right hand pain after fall 4 days prior. Slip on plastic fall forward landing on cement floor. Bruising about the right thumb and wrist. EXAM: RIGHT HAND - COMPLETE 3+ VIEW COMPARISON:  None. FINDINGS: No acute fracture or dislocation. 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 No erosions of the digits. Mild soft tissue edema about the wrist. IMPRESSION: 1. No acute fracture or dislocation. 2. 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 Findings may reflect rheumatoid arthritis, other forms of inflammatory arthropathies or septic arthritis are also considered. Electronically Signed   By: Rubye Oaks M.D.   On:  08/03/2015 00:49    ____________________________________________    PROCEDURES  Procedure(s) performed: None    Medications - No data to display   ____________________________________________   INITIAL IMPRESSION / ASSESSMENT AND PLAN / ED COURSE  Pertinent imaging results that were available during my care of the patient were reviewed by me and considered in my medical decision making (see chart for details).  Patient's diagnosis is consistent with contusions of the right hand, left knee, shin. Patient will be discharged home with instructions to apply ice to the effected areas 20 minutes 3-4 times daily. May take over-the-counter Tylenol or ibuprofen as needed for pain. Patient is to follow up with [facility] if symptoms persist past this treatment course. Patient is given ED precautions to return to the ED for any worsening or new symptoms.  ____________________________________________  FINAL CLINICAL IMPRESSION(S) / ED DIAGNOSES  Final diagnoses:  Contusion of right hand, initial encounter  Contusion of left knee, initial encounter  Contusion of chin, initial encounter  NEW MEDICATIONS STARTED DURING THIS VISIT:  New Prescriptions   No medications on file         Hope Pigeon, PA-C 08/03/15 0052  Rockne Menghini, MD 08/03/15 [phone]

## 2015-08-03 ENCOUNTER — Emergency Department: Payer: Worker's Compensation

## 2015-08-03 NOTE — Discharge Instructions (Signed)
Contusion °A contusion is a deep bruise. Contusions are the result of a blunt injury to tissues and muscle fibers under the skin. The injury causes bleeding under the skin. The skin overlying the contusion may turn blue, purple, or yellow. Minor injuries will give you a painless contusion, but more severe contusions may stay painful and swollen for a few weeks.  °CAUSES  °This condition is usually caused by a blow, trauma, or direct force to an area of the body. °SYMPTOMS  °Symptoms of this condition include: °· Swelling of the injured area. °· Pain and tenderness in the injured area. °· Discoloration. The area may have redness and then turn blue, purple, or yellow. °DIAGNOSIS  °This condition is diagnosed based on a physical exam and medical history. An X-ray, CT scan, or MRI may be needed to determine if there are any associated injuries, such as broken bones (fractures). °TREATMENT  °Specific treatment for this condition depends on what area of the body was injured. In general, the best treatment for a contusion is resting, icing, applying pressure to (compression), and elevating the injured area. This is often called the RICE strategy. Over-the-counter anti-inflammatory medicines may also be recommended for pain control.  °HOME CARE INSTRUCTIONS  °· Rest the injured area. °· If directed, apply ice to the injured area: °· Put ice in a plastic bag. °· Place a towel between your skin and the bag. °· Leave the ice on for 20 minutes, 2-3 times per day. °· If directed, apply light compression to the injured area using an elastic bandage. Make sure the bandage is not wrapped too tightly. Remove and reapply the bandage as directed by your health care provider. °· If possible, raise (elevate) the injured area above the level of your heart while you are sitting or lying down. °· Take over-the-counter and prescription medicines only as told by your health care provider. °SEEK MEDICAL CARE IF: °· Your symptoms do not  improve after several days of treatment. °· Your symptoms get worse. °· You have difficulty moving the injured area. °SEEK IMMEDIATE MEDICAL CARE IF:  °· You have severe pain. °· You have numbness in a hand or foot. °· Your hand or foot turns pale or cold. °  °This information is not intended to replace advice given to you by your health care provider. Make sure you discuss any questions you have with your health care provider. °  °Document Released: 03/07/2005 Document Revised: 02/16/2015 Document Reviewed: 10/13/2014 °Elsevier Interactive Patient Education ©2016 [employer] ° °Cryotherapy °Cryotherapy means treatment with cold. Ice or gel packs can be used to reduce both pain and swelling. Ice is the most helpful within the first 24 to 48 hours after an injury or flare-up from overusing a muscle or joint. Sprains, strains, spasms, burning pain, shooting pain, and aches can all be eased with ice. Ice can also be used when recovering from surgery. Ice is effective, has very few side effects, and is safe for most people to use. °PRECAUTIONS  °Ice is not a safe treatment option for people with: °· Raynaud phenomenon. This is a condition affecting small blood vessels in the extremities. Exposure to cold may cause your problems to return. °· Cold hypersensitivity. There are many forms of cold hypersensitivity, including: °¨ Cold urticaria. Red, itchy hives appear on the skin when the tissues begin to warm after being iced. °¨ Cold erythema. This is a red, itchy rash caused by exposure to cold. °¨ Cold hemoglobinuria. Red blood cells   break down when the tissues begin to warm after being iced. The hemoglobin that carry oxygen are passed into the urine because they cannot combine with blood proteins fast enough. °· Numbness or altered sensitivity in the area being iced. °If you have any of the following conditions, do not use ice until you have discussed cryotherapy with your caregiver: °· Heart conditions, such as  arrhythmia, angina, or chronic heart disease. °· High blood pressure. °· Healing wounds or open skin in the area being iced. °· Current infections. °· Rheumatoid arthritis. °· Poor circulation. °· Diabetes. °Ice slows the blood flow in the region it is applied. This is beneficial when trying to stop inflamed tissues from spreading irritating chemicals to surrounding tissues. However, if you expose your skin to cold temperatures for too long or without the proper protection, you can damage your skin or nerves. Watch for signs of skin damage due to cold. °HOME CARE INSTRUCTIONS °Follow these tips to use ice and cold packs safely. °· Place a dry or damp towel between the ice and skin. A damp towel will cool the skin more quickly, so you may need to shorten the time that the ice is used. °· For a more rapid response, add gentle compression to the ice. °· Ice for no more than 10 to 20 minutes at a time. The bonier the area you are icing, the less time it will take to get the benefits of ice. °· Check your skin after 5 minutes to make sure there are no signs of a poor response to cold or skin damage. °· Rest 20 minutes or more between uses. °· Once your skin is numb, you can end your treatment. You can test numbness by very lightly touching your skin. The touch should be so light that you do not see the skin dimple from the pressure of your fingertip. When using ice, most people will feel these normal sensations in this order: cold, burning, aching, and numbness. °· Do not use ice on someone who cannot communicate their responses to pain, such as small children or people with dementia. °HOW TO MAKE AN ICE PACK °Ice packs are the most common way to use ice therapy. Other methods include ice massage, ice baths, and cryosprays. Muscle creams that cause a cold, tingly feeling do not offer the same benefits that ice offers and should not be used as a substitute unless recommended by your caregiver. °To make an ice pack, do one  of the following: °· Place crushed ice or a bag of frozen vegetables in a sealable plastic bag. Squeeze out the excess air. Place this bag inside another plastic bag. Slide the bag into a pillowcase or place a damp towel between your skin and the bag. °· Mix 3 parts water with 1 part rubbing alcohol. Freeze the mixture in a sealable plastic bag. When you remove the mixture from the freezer, it will be slushy. Squeeze out the excess air. Place this bag inside another plastic bag. Slide the bag into a pillowcase or place a damp towel between your skin and the bag. °SEEK MEDICAL CARE IF: °· You develop white spots on your skin. This may give the skin a blotchy (mottled) appearance. °· Your skin turns blue or pale. °· Your skin becomes waxy or hard. °· Your swelling gets worse. °MAKE SURE YOU:  °· Understand these instructions. °· Will watch your condition. °· Will get help right away if you are not doing well or get worse. °  °  This information is not intended to replace advice given to you by your health care provider. Make sure you discuss any questions you have with your health care provider. °  °Document Released: 01/22/2011 Document Revised: 06/18/2014 Document Reviewed: 01/22/2011 °Elsevier Interactive Patient Education ©2016 [employer] ° °

## 2015-08-06 ENCOUNTER — Emergency Department
Admission: EM | Admit: 2015-08-06 | Discharge: 2015-08-06 | Disposition: A | Discharge status: Home / Self Care | Payer: Self-pay | Attending: Emergency Medicine | Admitting: Emergency Medicine

## 2015-08-06 DIAGNOSIS — Z79899 Other long term (current) drug therapy: Secondary | ICD-10-CM | POA: Insufficient documentation

## 2015-08-06 DIAGNOSIS — I10 Essential (primary) hypertension: Secondary | ICD-10-CM | POA: Insufficient documentation

## 2015-08-06 DIAGNOSIS — Z880 Allergy status to penicillin: Secondary | ICD-10-CM | POA: Insufficient documentation

## 2015-08-06 DIAGNOSIS — F17210 Nicotine dependence, cigarettes, uncomplicated: Secondary | ICD-10-CM | POA: Insufficient documentation

## 2015-08-06 DIAGNOSIS — L259 Unspecified contact dermatitis, unspecified cause: Secondary | ICD-10-CM | POA: Insufficient documentation

## 2015-08-06 MED ORDER — METHYLPREDNISOLONE 4 MG PO TBPK: ORAL_TABLET | ORAL | Status: DC

## 2015-08-06 MED ORDER — DEXAMETHASONE SODIUM PHOSPHATE 10 MG/ML IJ SOLN
10.0000 mg | Freq: Once | INTRAMUSCULAR | Status: AC
  Administered 2015-08-06: 10 mg via INTRAMUSCULAR

## 2015-08-06 MED ORDER — DEXAMETHASONE SODIUM PHOSPHATE 10 MG/ML IJ SOLN
INTRAMUSCULAR | Status: AC
  Administered 2015-08-06: 10 mg via INTRAMUSCULAR
  Filled 2015-08-06: qty 1

## 2015-08-06 MED ORDER — HYDROXYZINE HCL 50 MG PO TABS: 50.0000 mg | ORAL_TABLET | Freq: Three times a day (TID) | ORAL | Status: DC | PRN

## 2015-08-06 MED ORDER — HYDROXYZINE HCL 50 MG PO TABS
50.0000 mg | ORAL_TABLET | Freq: Once | ORAL | Status: AC
  Administered 2015-08-06: 50 mg via ORAL
  Filled 2015-08-06: qty 1

## 2015-08-06 NOTE — ED Notes (Signed)

## 2015-08-06 NOTE — Discharge Instructions (Signed)

## 2015-08-06 NOTE — ED Provider Notes (Signed)
[facility] Provider Note  ____________________________________________  Time seen: Approximately 11:02 PM  I have reviewed the triage vital signs and the nursing notes.   HISTORY  Chief Complaint Allergic Reaction; Urticaria; Pruritis; and Hoarse    HPI Sherry Wright is a 50 y.o. female patient complaining of hives, itching, and hoarseness since yesterday. Patient states she is has not received much relief using over-the-counter Benadryl. Patient believe a new soap is causing her hives. Patient denies any angioedema or anaphylactic signs and symptoms. She denies pain with this complaint. No other palliative measures for her complaint.   Past Medical History  Diagnosis Date  . Hypertension     There are no active problems to display for this patient.   Past Surgical History  Procedure Laterality Date  . Brain surgery      Current Outpatient Rx  Name  Route  Sig  Dispense  Refill  . acetaminophen (TYLENOL) 500 MG tablet   Oral   Take 500 mg by mouth every 6 (six) hours as needed.         . hydrochlorothiazide (HYDRODIURIL) 25 MG tablet   Oral   Take 1 tablet (25 mg total) by mouth daily.   30 tablet   0   . hydrOXYzine (ATARAX/VISTARIL) 50 MG tablet   Oral   Take 1 tablet (50 mg total) by mouth 3 (three) times daily as needed.   30 tablet   0   . methylPREDNISolone (MEDROL DOSEPAK) 4 MG TBPK tablet      Take Tapered dose as directed   21 tablet   0   . metoprolol tartrate (LOPRESSOR) 25 MG tablet   Oral   Take 1 tablet (25 mg total) by mouth daily.   30 tablet   0   . traMADol (ULTRAM) 50 MG tablet   Oral   Take 1 tablet (50 mg total) by mouth every 6 (six) hours as needed.   20 tablet   0     Allergies Penicillins  No family history on file.  Social History Social History  Substance Use Topics  . Smoking status: Current Every Day Smoker    Types: Cigarettes  . Smokeless tobacco: Not on  file  . Alcohol Use: No    Review of Systems Constitutional: No fever/chills Eyes: No visual changes. ENT: No sore throat. Cardiovascular: Denies chest pain. Respiratory: Denies shortness of breath. Gastrointestinal: No abdominal pain.  No nausea, no vomiting.  No diarrhea.  No constipation. Genitourinary: Negative for dysuria. Musculoskeletal: Negative for back pain. Skin: Positive for rash. Neurological: Negative for headaches, focal weakness or numbness. Endocrine:Hypertension Hematological/Lymphatic: Allergic/Immunilogical: Penicillin  10-point ROS otherwise negative.  ____________________________________________   PHYSICAL EXAM:  VITAL SIGNS: ED Triage Vitals  Enc Vitals Group     BP 08/06/15 2208 160/90 mmHg     Pulse Rate 08/06/15 2208 79     Resp 08/06/15 2208 18     Temp 08/06/15 2208 98 F (36.7 C)     Temp Source 08/06/15 2208 Oral     SpO2 08/06/15 2208 96 %     Weight 08/06/15 2208 170 lb (77.111 kg)     Height 08/06/15 2208 5\' 6"  (1.676 m)     Head Cir --      Peak Flow --      Pain Score 08/06/15 2210 0     Pain Loc --      Pain Edu? --  Excl. in GC? --     Constitutional: Alert and oriented. Well appearing and in no acute distress. Eyes: Conjunctivae are normal. PERRL. EOMI. Head: Atraumatic. Nose: No congestion/rhinnorhea. Mouth/Throat: Mucous membranes are moist.  Oropharynx non-erythematous. Neck: No stridor.  No cervical spine tenderness to palpation. Hematological/Lymphatic/Immunilogical: No cervical lymphadenopathy. Cardiovascular: Normal rate, regular rhythm. Grossly normal heart sounds.  Good peripheral circulation. Respiratory: Normal respiratory effort.  No retractions. Lungs CTAB. Gastrointestinal: Soft and nontender. No distention. No abdominal bruits. No CVA tenderness. Musculoskeletal: No lower extremity tenderness nor edema.  No joint effusions. Neurologic:  Normal speech and language. No gross focal neurologic deficits are  appreciated. No gait instability. Skin:  Skin is warm, dry and intact. Erythematous macular lesions Psychiatric: Mood and affect are normal. Speech and behavior are normal.  ____________________________________________   LABS (all labs ordered are listed, but only abnormal results are displayed)  Labs Reviewed - No data to display ____________________________________________  EKG   ____________________________________________  RADIOLOGY   ____________________________________________   PROCEDURES  Procedure(s) performed: None  Critical Care performed: No  ____________________________________________   INITIAL IMPRESSION / ASSESSMENT AND PLAN / ED COURSE  Pertinent labs & imaging results that were available during my care of the patient were reviewed by me and considered in my medical decision making (see chart for details).  Contact dermatitis . Patient given a prescription for Medrol dosepak and Atarax. Patient advised follow-up with family doctor if no improvement in 48 hours. ____________________________________________   FINAL CLINICAL IMPRESSION(S) / ED DIAGNOSES  Final diagnoses:  Contact dermatitis      Joni Reining, PA-C 08/06/15 2306  Joni Reining, PA-C 08/06/15 2308  Jennye Moccasin, MD 08/06/15 2330

## 2015-08-06 NOTE — ED Notes (Signed)
Pt states has had hives, itching, hoarse throat since yesterday. Pt states last benadryl at noon today. Pt appears in no acute distress.

## 2015-10-05 ENCOUNTER — Emergency Department: Payer: Self-pay

## 2015-10-05 ENCOUNTER — Emergency Department
Admission: EM | Admit: 2015-10-05 | Discharge: 2015-10-05 | Disposition: A | Discharge status: Home / Self Care | Payer: Self-pay | Attending: Emergency Medicine | Admitting: Emergency Medicine

## 2015-10-05 ENCOUNTER — Encounter: Payer: Self-pay | Admitting: *Deleted

## 2015-10-05 DIAGNOSIS — F17210 Nicotine dependence, cigarettes, uncomplicated: Secondary | ICD-10-CM | POA: Insufficient documentation

## 2015-10-05 DIAGNOSIS — R109 Unspecified abdominal pain: Secondary | ICD-10-CM | POA: Insufficient documentation

## 2015-10-05 DIAGNOSIS — I10 Essential (primary) hypertension: Secondary | ICD-10-CM | POA: Insufficient documentation

## 2015-10-05 DIAGNOSIS — Z79899 Other long term (current) drug therapy: Secondary | ICD-10-CM | POA: Insufficient documentation

## 2015-10-05 DIAGNOSIS — R1032 Left lower quadrant pain: Secondary | ICD-10-CM | POA: Insufficient documentation

## 2015-10-05 DIAGNOSIS — Z7952 Long term (current) use of systemic steroids: Secondary | ICD-10-CM | POA: Insufficient documentation

## 2015-10-05 LAB — COMPREHENSIVE METABOLIC PANEL
ALT: 17 U/L (ref 14–54)
AST: 19 U/L (ref 15–41)
Albumin: 4.2 g/dL (ref 3.5–5.0)
Alkaline Phosphatase: 86 U/L (ref 38–126)
Anion gap: 10 (ref 5–15)
BUN: 12 mg/dL (ref 6–20)
CO2: 28 mmol/L (ref 22–32)
Calcium: 9.2 mg/dL (ref 8.9–10.3)
Chloride: 100 mmol/L — ABNORMAL LOW (ref 101–111)
Creatinine, Ser: 0.64 mg/dL (ref 0.44–1.00)
GFR calc Af Amer: >60 mL/min (ref >60)
GFR calc non Af Amer: >60 mL/min (ref >60)
Glucose, Bld: 86 mg/dL (ref 65–99)
Potassium: 3.3 mmol/L — ABNORMAL LOW (ref 3.5–5.1)
Sodium: 138 mmol/L (ref 135–145)
Total Bilirubin: 0.2 mg/dL — ABNORMAL LOW (ref 0.3–1.2)
Total Protein: 7.8 g/dL (ref 6.5–8.1)

## 2015-10-05 LAB — URINALYSIS COMPLETE WITH MICROSCOPIC (ARMC ONLY)
BILIRUBIN URINE: NEGATIVE
Bacteria, UA: NONE SEEN
Glucose, UA: NEGATIVE mg/dL
Hgb urine dipstick: NEGATIVE
KETONES UR: NEGATIVE mg/dL
LEUKOCYTES UA: NEGATIVE
NITRITE: NEGATIVE
PH: 5 (ref 5.0–8.0)
Protein, ur: NEGATIVE mg/dL
Specific Gravity, Urine: 1.011 (ref 1.005–1.030)

## 2015-10-05 LAB — CBC
HEMATOCRIT: 41.5 % (ref 35.0–47.0)
HEMOGLOBIN: 14.1 g/dL (ref 12.0–16.0)
MCH: 28.1 pg (ref 26.0–34.0)
MCHC: 33.9 g/dL (ref 32.0–36.0)
MCV: 82.8 fL (ref 80.0–100.0)
Platelets: 287 10*3/uL (ref 150–440)
RBC: 5.01 MIL/uL (ref 3.80–5.20)
RDW: 15.5 % — ABNORMAL HIGH (ref 11.5–14.5)
WBC: 5.6 10*3/uL (ref 3.6–11.0)

## 2015-10-05 LAB — PREGNANCY, URINE: Preg Test, Ur: NEGATIVE

## 2015-10-05 MED ORDER — SODIUM CHLORIDE 0.9 % IV BOLUS (SEPSIS)
1000.0000 mL | Freq: Once | INTRAVENOUS | Status: AC
  Administered 2015-10-05: 1000 mL via INTRAVENOUS

## 2015-10-05 MED ORDER — TRAMADOL HCL 50 MG PO TABS: 50.0000 mg | ORAL_TABLET | Freq: Four times a day (QID) | ORAL | Status: AC | PRN

## 2015-10-05 MED ORDER — KETOROLAC TROMETHAMINE 30 MG/ML IJ SOLN
30.0000 mg | Freq: Once | INTRAMUSCULAR | Status: AC
  Administered 2015-10-05: 30 mg via INTRAVENOUS
  Filled 2015-10-05: qty 1

## 2015-10-05 NOTE — ED Notes (Signed)
Pt complains of left flank pain for three days, pt denies any other symptoms

## 2015-10-05 NOTE — ED Provider Notes (Signed)
[facility] Emergency Department Provider Note  Time seen: 2:23 PM  I have reviewed the triage vital signs and the nursing notes.   HISTORY  Chief Complaint Flank Pain    HPI Sherry Wright is a 50 y.o. female with a past medical history of hypertension presents the emergency department 3 days of intermittent left flank pain. According to the patient for the past 3 days she has had left flank pain/left lower quadrant pain that comes and goes. States moderate discomfort currently. AIDS approximately one year ago was diagnosed with diverticulitis with similar discomfort but denies any diarrhea, black or bloody stool. Denies any nausea, vomiting, fever, upper abdominal pain. Denies any history of kidney stones, dysuria or hematuria. Denies any vaginal bleeding or discharge.     Past Medical History  Diagnosis Date  . Hypertension     There are no active problems to display for this patient.   Past Surgical History  Procedure Laterality Date  . Brain surgery      Current Outpatient Rx  Name  Route  Sig  Dispense  Refill  . acetaminophen (TYLENOL) 500 MG tablet   Oral   Take 500 mg by mouth every 6 (six) hours as needed.         . hydrochlorothiazide (HYDRODIURIL) 25 MG tablet   Oral   Take 1 tablet (25 mg total) by mouth daily.   30 tablet   0   . hydrOXYzine (ATARAX/VISTARIL) 50 MG tablet   Oral   Take 1 tablet (50 mg total) by mouth 3 (three) times daily as needed.   30 tablet   0   . methylPREDNISolone (MEDROL DOSEPAK) 4 MG TBPK tablet      Take Tapered dose as directed   21 tablet   0   . metoprolol tartrate (LOPRESSOR) 25 MG tablet   Oral   Take 1 tablet (25 mg total) by mouth daily.   30 tablet   0   . traMADol (ULTRAM) 50 MG tablet   Oral   Take 1 tablet (50 mg total) by mouth every 6 (six) hours as needed.   20 tablet   0     Allergies Penicillins  No family history on file.  Social History Social History   Substance Use Topics  . Smoking status: Current Every Day Smoker -- 1.00 packs/day    Types: Cigarettes  . Smokeless tobacco: None  . Alcohol Use: No    Review of Systems Constitutional: Negative for fever. Cardiovascular: Negative for chest pain. Respiratory: Negative for shortness of breath. Gastrointestinal: Left lower quadrant pain. Left flank pain. Genitourinary: Negative for dysuria. Negative for hematuria Musculoskeletal: Negative for back pain. Neurological: Negative for headache 10-point ROS otherwise negative.  ____________________________________________   PHYSICAL EXAM:  VITAL SIGNS: ED Triage Vitals  Enc Vitals Group     BP 10/05/15 1133 158/88 mmHg     Pulse Rate 10/05/15 1133 82     Resp 10/05/15 1133 20     Temp 10/05/15 1133 97.7 F (36.5 C)     Temp Source 10/05/15 1133 Oral     SpO2 10/05/15 1133 99 %     Weight 10/05/15 1133 170 lb (77.111 kg)     Height 10/05/15 1133 5\' 7"  (1.702 m)     Head Cir --      Peak Flow --      Pain Score 10/05/15 1134 6     Pain Loc --  Pain Edu? --      Excl. in GC? --    Constitutional: Alert and oriented. Well appearing and in no distress. Eyes: Normal exam ENT   Head: Normocephalic and atraumatic.   Mouth/Throat: Mucous membranes are moist. Cardiovascular: Normal rate, regular rhythm. No murmur Respiratory: Normal respiratory effort without tachypnea nor retractions. Breath sounds are clear Gastrointestinal: Mild LLQ tenderness to palpation. No rebound or guarding. Chin. Musculoskeletal: Nontender with normal range of motion in all extremities.  Neurologic:  Normal speech and language. No gross focal neurologic deficits.  Skin:  Skin is warm, dry and intact.  Psychiatric: Mood and affect are normal.   ____________________________________________     RADIOLOGY  CT scan within normal limits  ____________________________________________  INITIAL IMPRESSION / ASSESSMENT AND PLAN / ED  COURSE  Pertinent labs & imaging results that were available during my care of the patient were reviewed by me and considered in my medical decision making (see chart for details).  Patient with 3 days of intermittent left lower quadrant pain/left flank pain. Largely negative review of systems otherwise. We will check labs, CT renal, and closely monitor. We'll dose Toradol in the emergency department.  Patient's labs including urine pregnancy tests are normal. Urinalysis is negative. CT scan is negative. At this point I do not have a clear cause for the patient's symptoms. I discussed with the patient a short course of pain medication, and follow up with her primary care physician in one to 2 days for recheck/reevaluation. I also discussed strict abdominal pain return precautions which the patient is agreeable.  ____________________________________________   FINAL CLINICAL IMPRESSION(S) / ED DIAGNOSES  Left flank pain   Minna Antis, MD 10/05/15 1616

## 2015-10-05 NOTE — ED Notes (Signed)
x3 days left flank pain radiating to pubic region, denies urinary symptoms, has been taking tylenol for pain control.

## 2015-10-05 NOTE — Discharge Instructions (Signed)
Flank Pain °Flank pain refers to pain that is located on the side of the body between the upper abdomen and the back. The pain may occur over a short period of time (acute) or may be long-term or reoccurring (chronic). It may be mild or severe. Flank pain can be caused by many things. °CAUSES  °Some of the more common causes of flank pain include: °· Muscle strains.   °· Muscle spasms.   °· A disease of your spine (vertebral disk disease).   °· A lung infection (pneumonia).   °· Fluid around your lungs (pulmonary edema).   °· A kidney infection.   °· Kidney stones.   °· A very painful skin rash caused by the chickenpox virus (shingles).   °· Gallbladder disease.   °HOME CARE INSTRUCTIONS  °Home care will depend on the cause of your pain. In general, °· Rest as directed by your caregiver. °· Drink enough fluids to keep your urine clear or pale yellow. °· Only take over-the-counter or prescription medicines as directed by your caregiver. Some medicines may help relieve the pain. °· Tell your caregiver about any changes in your pain. °· Follow up with your caregiver as directed. °SEEK IMMEDIATE MEDICAL CARE IF:  °· Your pain is not controlled with medicine.   °· You have new or worsening symptoms. °· Your pain increases.   °· You have abdominal pain.   °· You have shortness of breath.   °· You have persistent nausea or vomiting.   °· You have swelling in your abdomen.   °· You feel faint or pass out.   °· You have blood in your urine. °· You have a fever or persistent symptoms for more than 2-3 days. °· You have a fever and your symptoms suddenly get worse. °MAKE SURE YOU:  °· Understand these instructions. °· Will watch your condition. °· Will get help right away if you are not doing well or get worse. °  °This information is not intended to replace advice given to you by your health care provider. Make sure you discuss any questions you have with your health care provider. °  °Document Released: 07/19/2005 Document  Revised: 02/20/2012 Document Reviewed: 01/10/2012 °Elsevier Interactive Patient Education ©2016 [employer] ° °

## 2015-10-31 ENCOUNTER — Encounter: Payer: Self-pay | Admitting: Emergency Medicine

## 2015-10-31 ENCOUNTER — Emergency Department
Admission: EM | Admit: 2015-10-31 | Discharge: 2015-10-31 | Disposition: A | Discharge status: Home / Self Care | Payer: Self-pay | Attending: Emergency Medicine | Admitting: Emergency Medicine

## 2015-10-31 DIAGNOSIS — F17210 Nicotine dependence, cigarettes, uncomplicated: Secondary | ICD-10-CM | POA: Insufficient documentation

## 2015-10-31 DIAGNOSIS — Y939 Activity, unspecified: Secondary | ICD-10-CM | POA: Insufficient documentation

## 2015-10-31 DIAGNOSIS — Z79899 Other long term (current) drug therapy: Secondary | ICD-10-CM | POA: Insufficient documentation

## 2015-10-31 DIAGNOSIS — S76311A Strain of muscle, fascia and tendon of the posterior muscle group at thigh level, right thigh, initial encounter: Secondary | ICD-10-CM | POA: Insufficient documentation

## 2015-10-31 DIAGNOSIS — Y999 Unspecified external cause status: Secondary | ICD-10-CM | POA: Insufficient documentation

## 2015-10-31 DIAGNOSIS — Y92019 Unspecified place in single-family (private) house as the place of occurrence of the external cause: Secondary | ICD-10-CM | POA: Insufficient documentation

## 2015-10-31 DIAGNOSIS — I10 Essential (primary) hypertension: Secondary | ICD-10-CM | POA: Insufficient documentation

## 2015-10-31 DIAGNOSIS — Z7952 Long term (current) use of systemic steroids: Secondary | ICD-10-CM | POA: Insufficient documentation

## 2015-10-31 DIAGNOSIS — W010XXA Fall on same level from slipping, tripping and stumbling without subsequent striking against object, initial encounter: Secondary | ICD-10-CM | POA: Insufficient documentation

## 2015-10-31 MED ORDER — CYCLOBENZAPRINE HCL 5 MG PO TABS: 5.0000 mg | ORAL_TABLET | Freq: Three times a day (TID) | ORAL | Status: DC | PRN

## 2015-10-31 MED ORDER — NAPROXEN 500 MG PO TBEC: 500.0000 mg | DELAYED_RELEASE_TABLET | Freq: Two times a day (BID) | ORAL | Status: DC

## 2015-10-31 NOTE — ED Notes (Signed)
See triage note. Tripped over toy  Having pain to right hip and knee is able to bear wt with pain

## 2015-10-31 NOTE — ED Notes (Signed)
Pt presents to ED c/o right hip and knee pain due to tripping over a "baby toy".

## 2015-10-31 NOTE — Discharge Instructions (Signed)
Hamstring Strain A hamstring strain is an injury that occurs when the hamstring muscles are overstretched or overloaded. The hamstring muscles are a group of muscles at the back of the thighs. These muscles are used in straightening the hips, bending the knees, and pulling back the legs. This type of injury is often called a pulled hamstring muscle. The severity of a muscle strain is rated in degrees. First-degree strains have the least amount of muscle fiber tearing and pain. Second-degree and third-degree strains have increasingly more tearing and pain. CAUSES Hamstring strains occur when a sudden, violent force is placed on these muscles and stretches them too far. This often occurs during activities that involve running, jumping, kicking, or weight lifting. RISK FACTORS Hamstring strains are especially common in athletes. Other things that can increase your risk for this injury include:  Having low strength, endurance, or flexibility of the hamstring muscles.  Performing high-impact physical activity.  Having poor physical fitness.  Having a previous leg injury.  Having fatigued muscles.  Older age. SIGNS AND SYMPTOMS  Pain in the back of the thigh.  Bruising.  Swelling.  Muscle spasm.  Difficulty using the muscle because of pain or lack of normal function. For severe strains, you may have a popping or snapping feeling when the injury occurs. DIAGNOSIS Your health care provider will perform a physical exam and ask about your medical history.  TREATMENT Often, the best treatment for a hamstring strain is protecting, resting, icing, applying compression, and elevating the injured area. This is referred to as the PRICE method of treatment. Your health care provider may also recommend medicines to help reduce pain or inflammation. HOME CARE INSTRUCTIONS  Use the PRICE method of treatment to promote muscle healing during the first 2-3 days after your injury. The PRICE method  involves:  P--Protecting the muscle from being injured again.  R--Restricting your activity and resting the injured body part.  I--Icing your injury. To do this, put ice in a plastic bag. Place a towel between your skin and the bag. Then, apply the ice and leave it on for 20 minutes, 2-3 times per day. After the third day, switch to moist heat packs.  C--Applying compression to the injured area with an elastic bandage. Be careful not to wrap it too tightly. That may interfere with blood circulation or may increase swelling.  E--Elevating the injured body part above the level of your heart as often as you can. You can do this by putting a pillow under your thigh when you sit or lie down.  Take medicines only as directed by your health care provider.  Begin exercising or stretching as directed by your health care provider.  Do not return to full activity level until your health care provider approves.  Keep all follow-up visits as directed by your health care provider. This is important. SEEK MEDICAL CARE IF:  You have increasing pain or swelling in the injured area.  You have numbness, tingling, or a significant loss of strength in the injured area.  Your foot or your toes become cold or turn blue.   This information is not intended to replace advice given to you by your health care provider. Make sure you discuss any questions you have with your health care provider.   Document Released: 02/20/2001 Document Revised: 06/18/2014 Document Reviewed: 01/11/2014 Elsevier Interactive Patient Education 2016 [employer].   Take the prescription meds as directed. Follow-up with [facility] as needed.

## 2015-11-04 NOTE — ED Provider Notes (Signed)
[facility] Emergency Department Provider Note ____________________________________________  Time seen: 1806  I have reviewed the triage vital signs and the nursing notes.  HISTORY  Chief Complaint  Knee Pain; Hip Pain; and Fall  HPI Sherry Wright is a 50 y.o. female resents to the ED for evaluation of a fall at home about 1:00 this afternoon. She describes that from the baby's toy, and landing in a widely split. She has complaints of pain to the right groin, right hamstring, and right buttocks region. She denies any head injury, loss of consciousness, cuts, or scrapes.She rates her pain overall at 8/10 in triage.  Past Medical History  Diagnosis Date  . Hypertension     There are no active problems to display for this patient.   Past Surgical History  Procedure Laterality Date  . Brain surgery      Current Outpatient Rx  Name  Route  Sig  Dispense  Refill  . acetaminophen (TYLENOL) 500 MG tablet   Oral   Take 500 mg by mouth every 6 (six) hours as needed.         . cyclobenzaprine (FLEXERIL) 5 MG tablet   Oral   Take 1 tablet (5 mg total) by mouth every 8 (eight) hours as needed for muscle spasms.   15 tablet   0   . hydrochlorothiazide (HYDRODIURIL) 25 MG tablet   Oral   Take 1 tablet (25 mg total) by mouth daily.   30 tablet   0   . hydrOXYzine (ATARAX/VISTARIL) 50 MG tablet   Oral   Take 1 tablet (50 mg total) by mouth 3 (three) times daily as needed.   30 tablet   0   . methylPREDNISolone (MEDROL DOSEPAK) 4 MG TBPK tablet      Take Tapered dose as directed   21 tablet   0   . metoprolol tartrate (LOPRESSOR) 25 MG tablet   Oral   Take 1 tablet (25 mg total) by mouth daily.   30 tablet   0   . naproxen (EC NAPROSYN) 500 MG EC tablet   Oral   Take 1 tablet (500 mg total) by mouth 2 (two) times daily with a meal.   30 tablet   0   . traMADol (ULTRAM) 50 MG tablet   Oral   Take 1 tablet (50 mg total) by mouth every 6  (six) hours as needed.   20 tablet   0    Allergies Penicillins  History reviewed. No pertinent family history.  Social History Social History  Substance Use Topics  . Smoking status: Current Every Day Smoker -- 1.00 packs/day    Types: Cigarettes  . Smokeless tobacco: None  . Alcohol Use: No   Review of Systems  Constitutional: Negative for fever. Respiratory: Negative for shortness of breath. Gastrointestinal: Negative for abdominal pain, vomiting and diarrhea. Genitourinary: Negative for dysuria. Musculoskeletal: Negative for back pain. Reports right LE pain as above Skin: Negative for rash. Neurological: Negative for headaches, focal weakness or numbness. ____________________________________________  PHYSICAL EXAM:  VITAL SIGNS: ED Triage Vitals  Enc Vitals Group     BP 10/31/15 1731 122/66 mmHg     Pulse Rate 10/31/15 1731 79     Resp 10/31/15 1731 18     Temp 10/31/15 1731 97.8 F (36.6 C)     Temp Source 10/31/15 1731 Oral     SpO2 10/31/15 1731 99 %     Weight 10/31/15 1731 165 lb (74.844 kg)  Height 10/31/15 1731 5\' 7"  (1.702 m)     Head Cir --      Peak Flow --      Pain Score 10/31/15 1733 8     Pain Loc --      Pain Edu? --      Excl. in GC? --    Constitutional: Alert and oriented. Well appearing and in no distress. Head: Normocephalic and atraumatic. Cardiovascular: Normal rate, regular rhythm.  Respiratory: Normal respiratory effort. No wheezes/rales/rhonchi. Musculoskeletal: Right lower extremity without obvious deformity, dislocation, or 6 rotation. Patient transition from sit to stand without difficulty. She is mildly tender to palpation to the posterior hamstring difficulty region. She is able to demonstrate full lumbar flexion and extension without difficulty. She has a negative straight leg raise. Exam is unremarkable. Nontender with normal range of motion in all extremities.  Neurologic:  Normal gait without ataxia. Normal speech and  language. No gross focal neurologic deficits are appreciated. Skin:  Skin is warm, dry and intact. No rash noted. ____________________________________________  INITIAL IMPRESSION / ASSESSMENT AND PLAN / ED COURSE  Patient with an acute strain to the left hamstring following a ground-level fall. Her exam is unremarkable for any signs of child arrangement to the knee or dysfunction of the hip. Should be discharged with prescriptions for EC Naprosyn and Flexeril to dose as directed. Work note is provided for 2 days as requested. She spoke with her primary care provider and return to the ED for acutely worsening symptoms. ____________________________________________  FINAL CLINICAL IMPRESSION(S) / ED DIAGNOSES  Final diagnoses:  Fall from slip, trip, or stumble, initial encounter  Hamstring muscle strain, right, initial encounter     11/02/15, PA-C 11/04/15 11/06/15  [id number], MD 11/08/15 [phone]

## 2016-09-03 ENCOUNTER — Ambulatory Visit
Admission: RE | Admit: 2016-09-03 | Discharge: 2016-09-03 | Disposition: A | Discharge status: Home / Self Care | Payer: Disability Insurance | Source: Ambulatory Visit | Attending: Obstetrics and Gynecology | Admitting: Obstetrics and Gynecology

## 2016-09-03 ENCOUNTER — Other Ambulatory Visit: Payer: Self-pay | Admitting: Obstetrics and Gynecology

## 2016-09-03 DIAGNOSIS — M1990 Unspecified osteoarthritis, unspecified site: Secondary | ICD-10-CM

## 2016-09-03 DIAGNOSIS — R569 Unspecified convulsions: Secondary | ICD-10-CM | POA: Insufficient documentation

## 2016-09-03 DIAGNOSIS — H5462 Unqualified visual loss, left eye, normal vision right eye: Secondary | ICD-10-CM | POA: Insufficient documentation

## 2016-09-03 DIAGNOSIS — L4050 Arthropathic psoriasis, unspecified: Secondary | ICD-10-CM

## 2016-09-03 DIAGNOSIS — M069 Rheumatoid arthritis, unspecified: Secondary | ICD-10-CM | POA: Insufficient documentation

## 2016-09-03 DIAGNOSIS — M85872 Other specified disorders of bone density and structure, left ankle and foot: Secondary | ICD-10-CM | POA: Insufficient documentation

## 2016-09-03 DIAGNOSIS — I10 Essential (primary) hypertension: Secondary | ICD-10-CM | POA: Diagnosis not present

## 2016-10-01 IMAGING — CT CT RENAL STONE PROTOCOL
3 of 4 series · 10 of 46 positions shown, 17 images · non-contrast
Comparison: CT scan of March 12, 2015.

CLINICAL DATA: Acute right lower quadrant abdominal pain.

EXAM:
CT ABDOMEN AND PELVIS WITHOUT CONTRAST
TECHNIQUE: Multidetector CT imaging of the abdomen and pelvis was performed
following the standard protocol without IV contrast.

[Series 4: lung · axial · 0.80mm/px · z∈[-664,-558]mm · 6 of 31 slices shown, 11 images]
[im 5/31  soft-tissue]
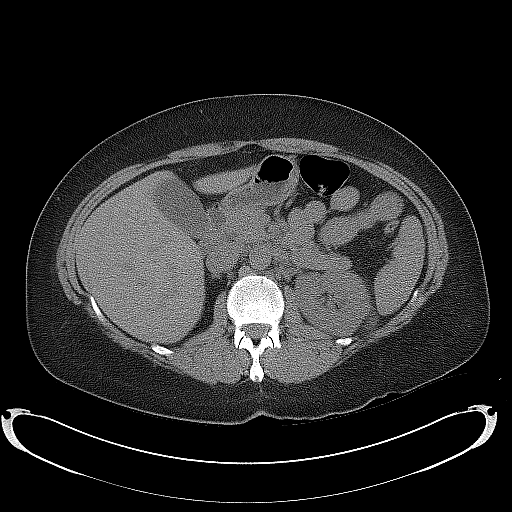
[im 5/31  bone]
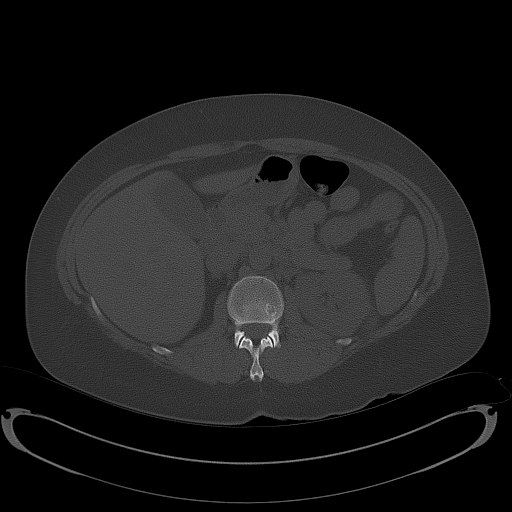
[im 9/31  soft-tissue]
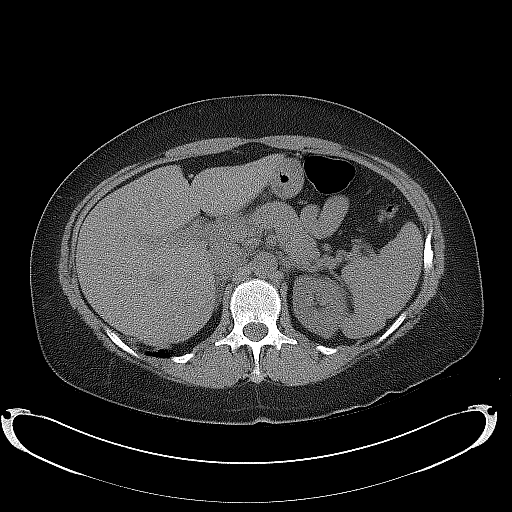
[im 13/31  soft-tissue]
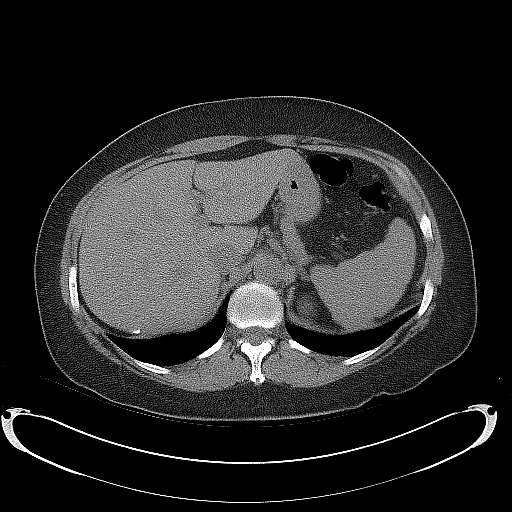
[im 13/31  lung]
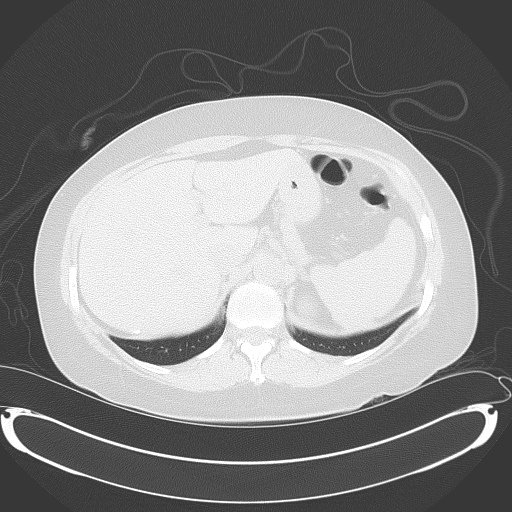
[im 18/31  soft-tissue]
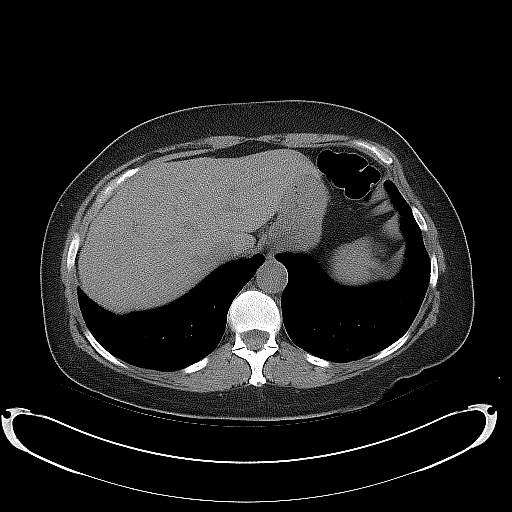
[im 18/31  lung]
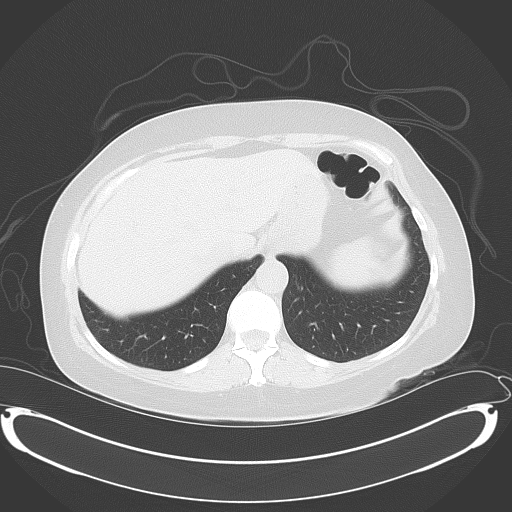
[im 22/31  soft-tissue]
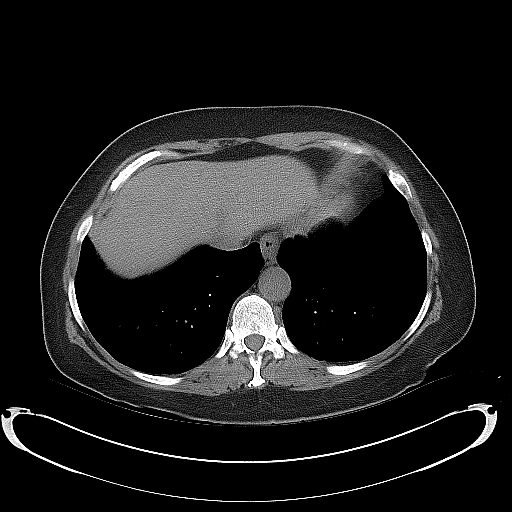
[im 22/31  lung]
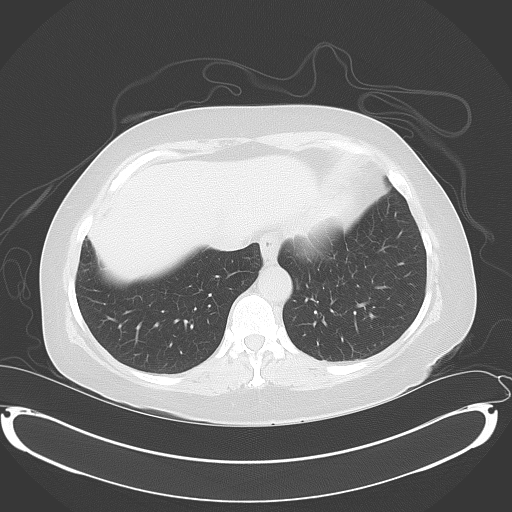
[im 26/31  soft-tissue]
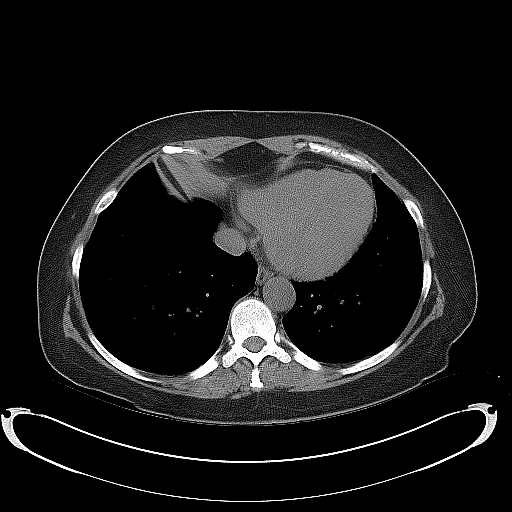
[im 26/31  lung]
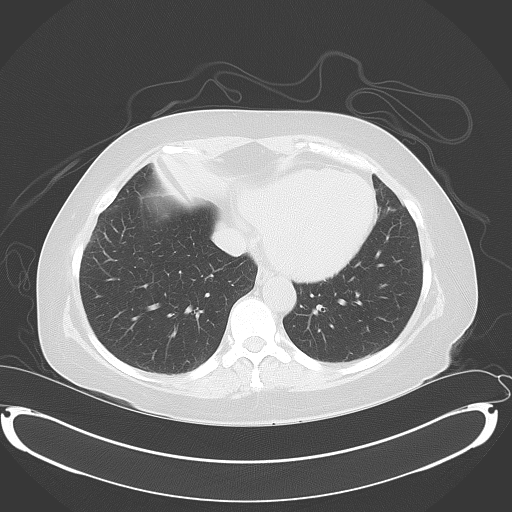

[Series 5: coronal · coronal · 0.91mm/px · 3 of 146 slices shown, 4 images]
[im 49/146  soft-tissue]
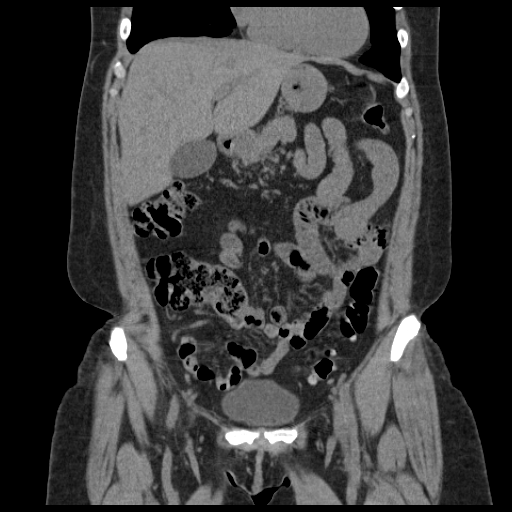
[im 65/146  soft-tissue]
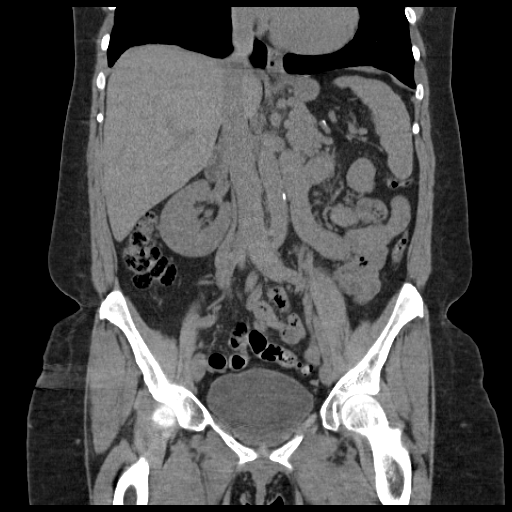
[im 65/146  bone]
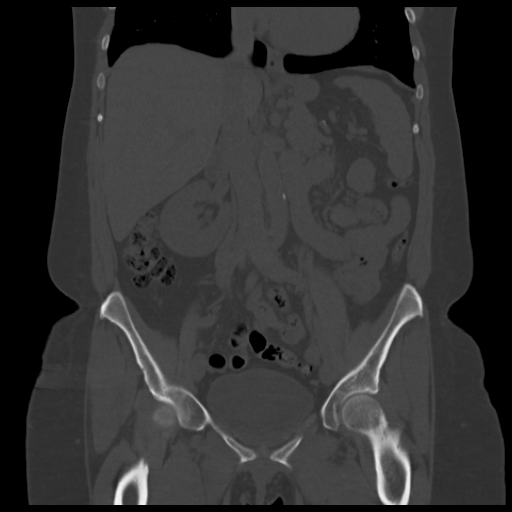
[im 81/146  soft-tissue]
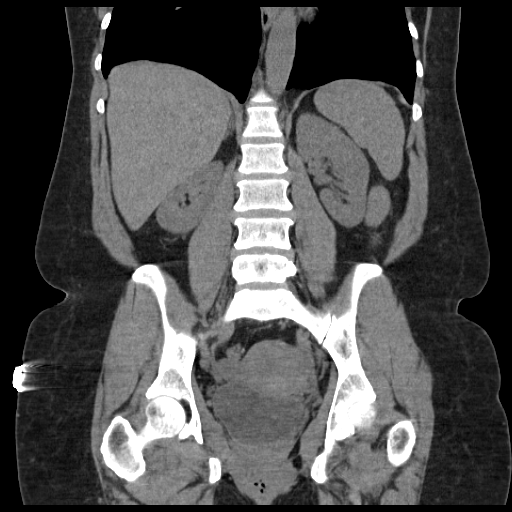

[Series 6: sagittal · sagittal · 0.56mm/px · 1 of 213 slices shown, 2 images]
[im 71/213  soft-tissue]
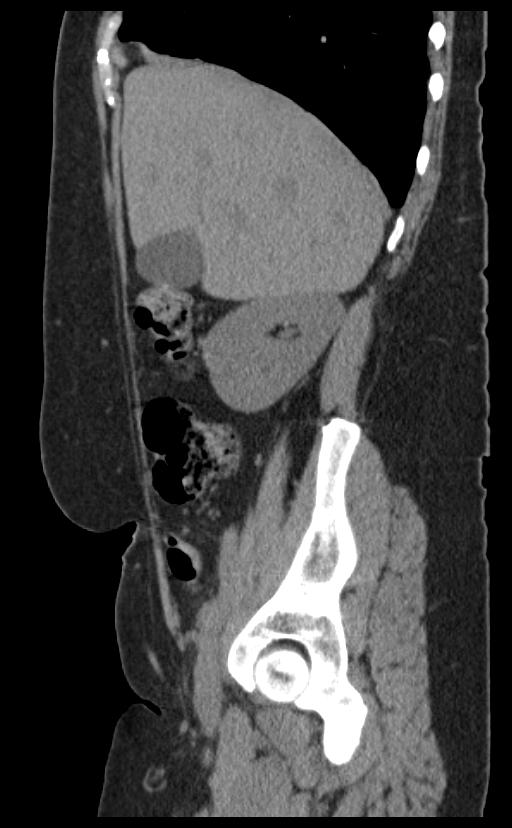
[im 71/213  bone]
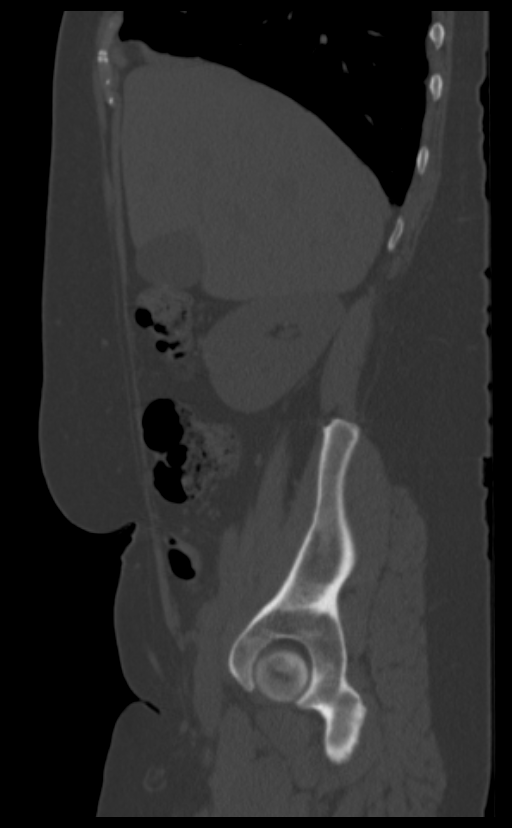

[10 of 46 positions shown; findings below may reference images not displayed]

FINDINGS: Visualized lung bases are unremarkable. No significant osseous
abnormality is noted.

No gallstones are noted. No focal abnormality is noted in the liver,
spleen or pancreas on these unenhanced images. Adrenal glands and
kidneys appear normal. No hydronephrosis or renal obstruction is
noted. No renal or ureteral calculi are noted. The appendix appears
normal. There is no evidence of bowel obstruction. No abnormal fluid
collection is noted. Sigmoid diverticulosis is noted without
inflammation. Urinary bladder appears normal. Uterus and ovaries
appear normal. No significant adenopathy is noted.
IMPRESSION: No hydronephrosis or renal obstruction is noted. No renal or
ureteral calculi are noted.

Sigmoid diverticulosis is noted without inflammation.

## 2017-10-02 ENCOUNTER — Other Ambulatory Visit: Payer: Self-pay | Admitting: Family Medicine

## 2017-10-02 DIAGNOSIS — Z1239 Encounter for other screening for malignant neoplasm of breast: Secondary | ICD-10-CM

## 2017-10-17 DIAGNOSIS — M059 Rheumatoid arthritis with rheumatoid factor, unspecified: Secondary | ICD-10-CM | POA: Insufficient documentation

## 2017-10-17 DIAGNOSIS — Z720 Tobacco use: Secondary | ICD-10-CM | POA: Insufficient documentation

## 2017-10-17 DIAGNOSIS — Z79899 Other long term (current) drug therapy: Secondary | ICD-10-CM | POA: Insufficient documentation

## 2017-10-28 ENCOUNTER — Ambulatory Visit
Admission: RE | Admit: 2017-10-28 | Discharge: 2017-10-28 | Disposition: A | Discharge status: Home / Self Care | Payer: Medicaid Other | Source: Ambulatory Visit | Attending: Family Medicine | Admitting: Family Medicine

## 2017-10-28 DIAGNOSIS — Z1239 Encounter for other screening for malignant neoplasm of breast: Secondary | ICD-10-CM | POA: Diagnosis present

## 2017-10-30 ENCOUNTER — Other Ambulatory Visit: Payer: Self-pay | Admitting: Family Medicine

## 2017-10-30 DIAGNOSIS — N6320 Unspecified lump in the left breast, unspecified quadrant: Secondary | ICD-10-CM

## 2017-10-30 DIAGNOSIS — R928 Other abnormal and inconclusive findings on diagnostic imaging of breast: Secondary | ICD-10-CM

## 2017-11-08 ENCOUNTER — Ambulatory Visit
Admission: RE | Admit: 2017-11-08 | Discharge: 2017-11-08 | Disposition: A | Discharge status: Home / Self Care | Payer: Medicaid Other | Source: Ambulatory Visit | Attending: Family Medicine | Admitting: Family Medicine

## 2017-11-08 DIAGNOSIS — R928 Other abnormal and inconclusive findings on diagnostic imaging of breast: Secondary | ICD-10-CM

## 2017-11-08 DIAGNOSIS — Z1231 Encounter for screening mammogram for malignant neoplasm of breast: Secondary | ICD-10-CM | POA: Diagnosis not present

## 2017-11-08 DIAGNOSIS — N6320 Unspecified lump in the left breast, unspecified quadrant: Secondary | ICD-10-CM

## 2017-11-08 DIAGNOSIS — N6323 Unspecified lump in the left breast, lower outer quadrant: Secondary | ICD-10-CM | POA: Insufficient documentation

## 2018-04-23 ENCOUNTER — Other Ambulatory Visit: Payer: Self-pay

## 2018-04-23 ENCOUNTER — Emergency Department
Admission: EM | Admit: 2018-04-23 | Discharge: 2018-04-23 | Disposition: A | Discharge status: Home / Self Care | Payer: Medicaid Other | Attending: Student in an Organized Health Care Education/Training Program | Admitting: Student in an Organized Health Care Education/Training Program

## 2018-04-23 ENCOUNTER — Emergency Department: Payer: Medicaid Other

## 2018-04-23 ENCOUNTER — Encounter: Payer: Self-pay | Admitting: Emergency Medicine

## 2018-04-23 DIAGNOSIS — Z79899 Other long term (current) drug therapy: Secondary | ICD-10-CM | POA: Diagnosis not present

## 2018-04-23 DIAGNOSIS — I10 Essential (primary) hypertension: Secondary | ICD-10-CM | POA: Insufficient documentation

## 2018-04-23 DIAGNOSIS — R1011 Right upper quadrant pain: Secondary | ICD-10-CM | POA: Diagnosis not present

## 2018-04-23 DIAGNOSIS — F17210 Nicotine dependence, cigarettes, uncomplicated: Secondary | ICD-10-CM | POA: Diagnosis not present

## 2018-04-23 LAB — CBC
HCT: 42.3 % (ref 36.0–46.0)
HEMOGLOBIN: 14.2 g/dL (ref 12.0–15.0)
MCH: 30.4 pg (ref 26.0–34.0)
MCHC: 33.6 g/dL (ref 30.0–36.0)
MCV: 90.6 fL (ref 80.0–100.0)
PLATELETS: 347 10*3/uL (ref 150–400)
RBC: 4.67 MIL/uL (ref 3.87–5.11)
RDW: 12.7 % (ref 11.5–15.5)
WBC: 7.2 10*3/uL (ref 4.0–10.5)
nRBC: 0 % (ref 0.0–0.2)

## 2018-04-23 LAB — COMPREHENSIVE METABOLIC PANEL
ALK PHOS: 93 U/L (ref 38–126)
ALT: 27 U/L (ref 0–44)
AST: 27 U/L (ref 15–41)
Albumin: 4 g/dL (ref 3.5–5.0)
Anion gap: 9 (ref 5–15)
BUN: 9 mg/dL (ref 6–20)
CALCIUM: 9.2 mg/dL (ref 8.9–10.3)
CO2: 26 mmol/L (ref 22–32)
CREATININE: 0.49 mg/dL (ref 0.44–1.00)
Chloride: 105 mmol/L (ref 98–111)
GFR calc Af Amer: >60 mL/min (ref >60)
GFR calc non Af Amer: >60 mL/min (ref >60)
GLUCOSE: 140 mg/dL — AB (ref 70–99)
POTASSIUM: 3.3 mmol/L — AB (ref 3.5–5.1)
SODIUM: 140 mmol/L (ref 135–145)
TOTAL PROTEIN: 7.3 g/dL (ref 6.5–8.1)
Total Bilirubin: 0.5 mg/dL (ref 0.3–1.2)

## 2018-04-23 LAB — URINALYSIS, COMPLETE (UACMP) WITH MICROSCOPIC
Bacteria, UA: NONE SEEN
Bilirubin Urine: NEGATIVE
GLUCOSE, UA: NEGATIVE mg/dL
Ketones, ur: NEGATIVE mg/dL
Leukocytes, UA: NEGATIVE
NITRITE: NEGATIVE
PROTEIN: NEGATIVE mg/dL
Specific Gravity, Urine: 1.01 (ref 1.005–1.030)
pH: 5 (ref 5.0–8.0)

## 2018-04-23 LAB — LIPASE, BLOOD: Lipase: 27 U/L (ref 11–51)

## 2018-04-23 MED ORDER — PROMETHAZINE HCL 12.5 MG PO TABS: 12.5000 mg | ORAL_TABLET | Freq: Four times a day (QID) | ORAL | 0 refills | Status: DC | PRN

## 2018-04-23 MED ORDER — DICYCLOMINE HCL 10 MG PO CAPS: 10.0000 mg | ORAL_CAPSULE | Freq: Three times a day (TID) | ORAL | 0 refills | Status: DC | PRN

## 2018-04-23 NOTE — ED Provider Notes (Signed)
[facility] Emergency Department Provider Note    First MD Initiated Contact with Patient 04/23/18 1404     (approximate)  I have reviewed the triage vital signs and the nursing notes.   HISTORY  Chief Complaint Abdominal Pain    HPI Sherry Wright is a 52 y.o. female a history of hypertension as well as rheumatoid arthritis on methotrexate presents the ER with 3 weeks of intermittent right upper quadrant pain radiating through to her back.  Pain is worsened by spicy and fatty foods.  Denies any fevers.  Pain started several minutes after eating.  Denies any fevers.  No chest pain or shortness of breath.  States the pain is mild to moderate in severity.    Past Medical History:  Diagnosis Date  . Hypertension    Family History  Problem Relation Age of Onset  . Breast cancer Mother 10       deceased  . Breast cancer Maternal Aunt 55  . Breast cancer Maternal Grandfather 62  . Breast cancer Cousin 35   Past Surgical History:  Procedure Laterality Date  . BRAIN SURGERY     There are no active problems to display for this patient.     Prior to Admission medications   Medication Sig Start Date End Date Taking? Authorizing Provider  acetaminophen (TYLENOL) 500 MG tablet Take 500 mg by mouth every 6 (six) hours as needed.    [provider]  cyclobenzaprine (FLEXERIL) 5 MG tablet Take 1 tablet (5 mg total) by mouth every 8 (eight) hours as needed for muscle spasms. 10/31/15   Menshew, Charlesetta Ivory, PA-C  hydrochlorothiazide (HYDRODIURIL) 25 MG tablet Take 1 tablet (25 mg total) by mouth daily. 05/25/15   Rockne Menghini, MD  hydrOXYzine (ATARAX/VISTARIL) 50 MG tablet Take 1 tablet (50 mg total) by mouth 3 (three) times daily as needed. 08/06/15   Joni Reining, PA-C  methylPREDNISolone (MEDROL DOSEPAK) 4 MG TBPK tablet Take Tapered dose as directed 08/06/15   Joni Reining, PA-C  metoprolol tartrate (LOPRESSOR) 25 MG tablet Take 1  tablet (25 mg total) by mouth daily. 05/25/15   Rockne Menghini, MD  naproxen (EC NAPROSYN) 500 MG EC tablet Take 1 tablet (500 mg total) by mouth 2 (two) times daily with a meal. 10/31/15   Menshew, Charlesetta Ivory, PA-C    Allergies Penicillins    Social History Social History   Tobacco Use  . Smoking status: Current Every Day Smoker    Packs/day: 1.00    Types: Cigarettes  . Smokeless tobacco: Never Used  Substance Use Topics  . Alcohol use: No  . Drug use: No    Review of Systems Patient denies headaches, rhinorrhea, blurry vision, numbness, shortness of breath, chest pain, edema, cough, abdominal pain, nausea, vomiting, diarrhea, dysuria, fevers, rashes or hallucinations unless otherwise stated above in HPI. ____________________________________________   PHYSICAL EXAM:  VITAL SIGNS: Vitals:   04/23/18 1321  BP: (!) 113/57  Pulse: (!) 115  Resp: 20  Temp: 97.8 F (36.6 C)  SpO2: 96%    Constitutional: Alert and oriented.  Eyes: Conjunctivae are normal.  Head: Atraumatic. Nose: No congestion/rhinnorhea. Mouth/Throat: Mucous membranes are moist.   Neck: No stridor. Painless ROM.  Cardiovascular: Normal rate, regular rhythm. Grossly normal heart sounds.  Good peripheral circulation. Respiratory: Normal respiratory effort.  No retractions. Lungs CTAB. Gastrointestinal: Soft and nontender. No distention. No abdominal bruits. No CVA tenderness. Genitourinary:  Musculoskeletal: No lower extremity tenderness  nor edema.  No joint effusions. Neurologic:  Normal speech and language. No gross focal neurologic deficits are appreciated. No facial droop Skin:  Skin is warm, dry and intact. No rash noted. Psychiatric: Mood and affect are normal. Speech and behavior are normal.  ____________________________________________   LABS (all labs ordered are listed, but only abnormal results are displayed)  Results for orders placed or performed during the hospital  encounter of 04/23/18 (from the past 24 hour(s))  Lipase, blood     Status: None   Collection Time: 04/23/18  1:26 PM  Result Value Ref Range   Lipase 27 11 - 51 U/L  Comprehensive metabolic panel     Status: Abnormal   Collection Time: 04/23/18  1:26 PM  Result Value Ref Range   Sodium 140 135 - 145 mmol/L   Potassium 3.3 (L) 3.5 - 5.1 mmol/L   Chloride 105 98 - 111 mmol/L   CO2 26 22 - 32 mmol/L   Glucose, Bld 140 (H) 70 - 99 mg/dL   BUN 9 6 - 20 mg/dL   Creatinine, Ser 1.47 0.44 - 1.00 mg/dL   Calcium 9.2 8.9 - 82.9 mg/dL   Total Protein 7.3 6.5 - 8.1 g/dL   Albumin 4.0 3.5 - 5.0 g/dL   AST 27 15 - 41 U/L   ALT 27 0 - 44 U/L   Alkaline Phosphatase 93 38 - 126 U/L   Total Bilirubin 0.5 0.3 - 1.2 mg/dL   GFR calc non Af Amer >60 >60 mL/min   GFR calc Af Amer >60 >60 mL/min   Anion gap 9 5 - 15  CBC     Status: None   Collection Time: 04/23/18  1:26 PM  Result Value Ref Range   WBC 7.2 4.0 - 10.5 K/uL   RBC 4.67 3.87 - 5.11 MIL/uL   Hemoglobin 14.2 12.0 - 15.0 g/dL   HCT 56.2 13.0 - 86.5 %   MCV 90.6 80.0 - 100.0 fL   MCH 30.4 26.0 - 34.0 pg   MCHC 33.6 30.0 - 36.0 g/dL   RDW 78.4 69.6 - 29.5 %   Platelets 347 150 - 400 K/uL   nRBC 0.0 0.0 - 0.2 %  Urinalysis, Complete w Microscopic     Status: Abnormal   Collection Time: 04/23/18  2:50 PM  Result Value Ref Range   Color, Urine YELLOW (A) YELLOW   APPearance CLEAR (A) CLEAR   Specific Gravity, Urine 1.010 1.005 - 1.030   pH 5.0 5.0 - 8.0   Glucose, UA NEGATIVE NEGATIVE mg/dL   Hgb urine dipstick SMALL (A) NEGATIVE   Bilirubin Urine NEGATIVE NEGATIVE   Ketones, ur NEGATIVE NEGATIVE mg/dL   Protein, ur NEGATIVE NEGATIVE mg/dL   Nitrite NEGATIVE NEGATIVE   Leukocytes, UA NEGATIVE NEGATIVE   RBC / HPF 0-5 0 - 5 RBC/hpf   WBC, UA 0-5 0 - 5 WBC/hpf   Bacteria, UA NONE SEEN NONE SEEN   Squamous Epithelial / LPF 6-10 0 - 5   Mucus PRESENT     ____________________________________________ _______________________________  RADIOLOGY  I personally reviewed all radiographic images ordered to evaluate for the above acute complaints and reviewed radiology reports and findings.  These findings were personally discussed with the patient.  Please see medical record for radiology report.  ____________________________________________   PROCEDURES  Procedure(s) performed:  Procedures    Critical Care performed: no ____________________________________________   INITIAL IMPRESSION / ASSESSMENT AND PLAN / ED COURSE  Pertinent labs & imaging results  that were available during my care of the patient were reviewed by me and considered in my medical decision making (see chart for details).   DDX: choleithiasis, biliary colic, pancreatitis, stone, doubt cholecystitis  Sherry Wright is a 52 y.o. who presents to the ED with symptoms as described above.  She is afebrile, mildly tachycardic but well-perfused and nontoxic-appearing.  Blood work ordered at triage is reassuring without any white count or biliary pathology but given location of pain description certainly concerning for biliary colic or cholelithiasis.  Will order right upper quadrant ultrasound to evaluate.  Patient declining any pain medication at this time.  Clinical Course as of Apr 23 1546  Wed Apr 23, 2018  1547 Ultrasound is reassuring.  Repeat abdominal exam soft benign.  Patient is afebrile.  No white count.  No biliary obstruction.  This point do believe she stable and appropriate for trial of outpatient management.  Have discussed with the patient and available family all diagnostics and treatments performed thus far and all questions were answered to the best of my ability. The patient demonstrates understanding and agreement with plan.    [PR]    Clinical Course User Index [PR] Willy Eddy, MD     As part of my medical decision making, I reviewed the  following data within the electronic MEDICAL RECORD NUMBER Nursing notes reviewed and incorporated, Labs reviewed, notes from prior ED visits.   ____________________________________________   FINAL CLINICAL IMPRESSION(S) / ED DIAGNOSES  Final diagnoses:  RUQ abdominal pain      NEW MEDICATIONS STARTED DURING THIS VISIT:  New Prescriptions   No medications on file     Note:  This document was prepared using Dragon voice recognition software and may include unintentional dictation errors.    Willy Eddy, MD 04/23/18 [phone]

## 2018-04-23 NOTE — ED Notes (Signed)
Pt c/o intermittent RUQ pain that radiates into the back for the past 3 weeks, worse after eating and states she was seen at [facility] today and told it was probably her gall bladder and referred to the ED for further eval.

## 2018-04-23 NOTE — ED Notes (Signed)
Pt returned from ultrasound

## 2018-04-23 NOTE — ED Notes (Signed)
FIRST NURSE NOTE:  Pt c/o RUQ pain that pt states thinks is her gallbladder, pt states she went to the walk-in clinic, but were unable to do anything for her. Pt states her sxs have been going on for "a while" No distress noted on arrival, pt drinking drink from [employer].

## 2018-04-23 NOTE — Discharge Instructions (Signed)

## 2018-04-23 NOTE — ED Triage Notes (Signed)
Pt in via POV with complaints of RUQ abdominal pain, worsening over the last three weeks, pain worse after eating, reports intermittent diarrhea, denies any N/V.  NAD noted at this time.

## 2018-05-01 ENCOUNTER — Other Ambulatory Visit: Payer: Self-pay

## 2018-05-01 ENCOUNTER — Ambulatory Visit: Payer: Medicaid Other | Admitting: Surgery

## 2018-05-01 ENCOUNTER — Encounter: Payer: Self-pay | Admitting: Surgery

## 2018-05-01 VITALS — BP 130/78 | HR 80 | Temp 97.9°F | Resp 13 | Ht 67.0 in | Wt 192.0 lb

## 2018-05-01 DIAGNOSIS — K828 Other specified diseases of gallbladder: Secondary | ICD-10-CM

## 2018-05-01 NOTE — Patient Instructions (Addendum)
The patient is scheduled for surgery with Dr Earlene Plater at [facility] on 05/16/18. She will pre admit by phone. We will obtain medical clearance from her medical doctor. The patient is aware of date and instructions.   Laparoscopic Cholecystectomy Laparoscopic cholecystectomy is surgery to remove the gallbladder. The gallbladder is a pear-shaped organ that lies beneath the liver on the right side of the body. The gallbladder stores bile, which is a fluid that helps the body to digest fats. Cholecystectomy is often done for inflammation of the gallbladder (cholecystitis). This condition is usually caused by a buildup of gallstones (cholelithiasis) in the gallbladder. Gallstones can block the flow of bile, which can result in inflammation and pain. In severe cases, emergency surgery may be required. This procedure is done though small incisions in your abdomen (laparoscopic surgery). A thin scope with a camera (laparoscope) is inserted through one incision. Thin surgical instruments are inserted through the other incisions. In some cases, a laparoscopic procedure may be turned into a type of surgery that is done through a larger incision (open surgery). Tell a health care provider about:  Any allergies you have.  All medicines you are taking, including vitamins, herbs, eye drops, creams, and over-the-counter medicines.  Any problems you or family members have had with anesthetic medicines.  Any blood disorders you have.  Any surgeries you have had.  Any medical conditions you have.  Whether you are pregnant or may be pregnant. What are the risks? Generally, this is a safe procedure. However, problems may occur, including:  Infection.  Bleeding.  Allergic reactions to medicines.  Damage to other structures or organs.  A stone remaining in the common bile duct. The common bile duct carries bile from the gallbladder into the small intestine.  A bile leak from the cyst duct that is clipped when your  gallbladder is removed.  What happens before the procedure? Staying hydrated Follow instructions from your health care provider about hydration, which may include:  Up to 2 hours before the procedure - you may continue to drink clear liquids, such as water, clear fruit juice, black coffee, and plain tea.  Eating and drinking restrictions Follow instructions from your health care provider about eating and drinking, which may include:  8 hours before the procedure - stop eating heavy meals or foods such as meat, fried foods, or fatty foods.  6 hours before the procedure - stop eating light meals or foods, such as toast or cereal.  6 hours before the procedure - stop drinking milk or drinks that contain milk.  2 hours before the procedure - stop drinking clear liquids.  Medicines  Ask your health care provider about: ? Changing or stopping your regular medicines. This is especially important if you are taking diabetes medicines or blood thinners. ? Taking medicines such as aspirin and ibuprofen. These medicines can thin your blood. Do not take these medicines before your procedure if your health care provider instructs you not to.  You may be given antibiotic medicine to help prevent infection. General instructions  Let your health care provider know if you develop a cold or an infection before surgery.  Plan to have someone take you home from the hospital or clinic.  Ask your health care provider how your surgical site will be marked or identified. What happens during the procedure?  To reduce your risk of infection: ? Your health care team will wash or sanitize their hands. ? Your skin will be washed with soap. ? Hair  may be removed from the surgical area.  An IV tube may be inserted into one of your veins.  You will be given one or more of the following: ? A medicine to help you relax (sedative). ? A medicine to make you fall asleep (general anesthetic).  A breathing  tube will be placed in your mouth.  Your surgeon will make several small cuts (incisions) in your abdomen.  The laparoscope will be inserted through one of the small incisions. The camera on the laparoscope will send images to a TV screen (monitor) in the operating room. This lets your surgeon see inside your abdomen.  Air-like gas will be pumped into your abdomen. This will expand your abdomen to give the surgeon more room to perform the surgery.  Other tools that are needed for the procedure will be inserted through the other incisions. The gallbladder will be removed through one of the incisions.  Your common bile duct may be examined. If stones are found in the common bile duct, they may be removed.  After your gallbladder has been removed, the incisions will be closed with stitches (sutures), staples, or skin glue.  Your incisions may be covered with a bandage (dressing). The procedure may vary among health care providers and hospitals. What happens after the procedure?  Your blood pressure, heart rate, breathing rate, and blood oxygen level will be monitored until the medicines you were given have worn off.  You will be given medicines as needed to control your pain.  Do not drive for 24 hours if you were given a sedative. This information is not intended to replace advice given to you by your health care provider. Make sure you discuss any questions you have with your health care provider. Document Released: 05/28/2005 Document Revised: 12/18/2015 Document Reviewed: 11/14/2015 Elsevier Interactive Patient Education  2018 [employer].

## 2018-05-01 NOTE — Progress Notes (Signed)
Surgical Clinic History and Physical  Referring provider:  Center, [facility] [address] [place], [place] [address]  HISTORY OF PRESENT ILLNESS (HPI):  52 y.o. female presents for evaluation of abdominal pain. Patient reports post-prandial RUQ abdominal pain after fatty and greasy foods in particular x >9 months since patient retired/hasn't been working and patient says has been eating more fatty fast foods than she did previously when she was employed. She has also during the same time gained weight accordingly. Over the past 3 weeks, patient describes her post-prandial RUQ abdominal pain has worsened and become more frequent. Patient also takes BID Zantac for GERD, but says she does not experience GERD as long as she takes her Zantac and denies worsened symptoms with spicy or acidic foods. Patient otherwise reports +flatus and BM's WNL, denies fever/chills or CP. Patient's daughter adds that patient does work to catch her breath somewhat when she reaches the top of a flight of steps.  PAST MEDICAL HISTORY (PMH):  Past Medical History:  Diagnosis Date  . Arthritis   . Hypertension     PAST SURGICAL HISTORY (PSH):  Past Surgical History:  Procedure Laterality Date  . BRAIN SURGERY      MEDICATIONS:  Prior to Admission medications   Medication Sig Start Date End Date Taking? Authorizing Provider  cyclobenzaprine (FLEXERIL) 5 MG tablet Take 1 tablet (5 mg total) by mouth every 8 (eight) hours as needed for muscle spasms. 10/31/15  Yes Menshew, Charlesetta Ivory, PA-C  dicyclomine (BENTYL) 10 MG capsule Take 1 capsule (10 mg total) by mouth 3 (three) times daily as needed for up to 14 days for spasms. 04/23/18 05/07/18 Yes Willy Eddy, MD  hydrochlorothiazide (HYDRODIURIL) 25 MG tablet Take 1 tablet (25 mg total) by mouth daily. 05/25/15  Yes Rockne Menghini, MD  lisinopril-hydrochlorothiazide (PRINZIDE,ZESTORETIC) 20-12.5 MG tablet Take by mouth.   Yes [provider]  methotrexate (RHEUMATREX) 2.5 MG tablet 8 tabs (20 mg ) once per week, 12 weeks 11/13/17  Yes [provider]  methylPREDNISolone (MEDROL DOSEPAK) 4 MG TBPK tablet Take Tapered dose as directed 08/06/15  Yes Joni Reining, PA-C    ALLERGIES:  Allergies  Allergen Reactions  . Penicillins Hives    Has patient had a PCN reaction causing immediate rash, facial/tongue/throat swelling, SOB or lightheadedness with hypotension: yes Has patient had a PCN reaction causing severe rash involving mucus membranes or skin necrosis: no Has patient had a PCN reaction that required hospitalization no Has patient had a PCN reaction occurring within the last 10 years:no If all of the above answers are "NO", then may proceed with Cephalosporin use.   . Folic Acid Other (See Comments)    Upset Stomach  . Prednisone Palpitations    Patient said that prednisone caused her heart to flutter when she was on this medication.    SOCIAL HISTORY:  Social History   Socioeconomic History  . Marital status: Widowed    Spouse name: Not on file  . Number of children: Not on file  . Years of education: Not on file  . Highest education level: Not on file  Occupational History  . Not on file  Social Needs  . Financial resource strain: Not on file  . Food insecurity:    Worry: Not on file    Inability: Not on file  . Transportation needs:    Medical: Not on file    Non-medical: Not on file  Tobacco Use  . Smoking  status: Current Every Day Smoker    Packs/day: 1.00    Types: Cigarettes  . Smokeless tobacco: Never Used  Substance and Sexual Activity  . Alcohol use: No  . Drug use: No  . Sexual activity: Not on file  Lifestyle  . Physical activity:    Days per week: Not on file    Minutes per session: Not on file  . Stress: Not on file  Relationships  . Social connections:    Talks on phone: Not on file    Gets together: Not on file    Attends religious service: Not on file     Active member of club or organization: Not on file    Attends meetings of clubs or organizations: Not on file    Relationship status: Not on file  . Intimate partner violence:    Fear of current or ex partner: Not on file    Emotionally abused: Not on file    Physically abused: Not on file    Forced sexual activity: Not on file  Other Topics Concern  . Not on file  Social History Narrative  . Not on file    The patient currently resides (home / rehab facility / nursing home): Home The patient normally is (ambulatory / bedbound): Ambulatory  FAMILY HISTORY:  Family History  Problem Relation Age of Onset  . Breast cancer Mother 61       deceased  . Breast cancer Maternal Aunt 55  . Breast cancer Maternal Grandfather 80  . Breast cancer Cousin 35    Otherwise negative/non-contributory.  REVIEW OF SYSTEMS:  Constitutional: denies any other weight loss, fever, chills, or sweats  Eyes: denies any other vision changes, history of eye injury  ENT: denies sore throat, hearing problems  Respiratory: denies shortness of breath, wheezing  Cardiovascular: denies chest pain, palpitations  Gastrointestinal: abdominal pain, N/V, and bowel function as per HPI Musculoskeletal: denies any other joint pains or cramps  Skin: Denies any other rashes or skin discolorations Neurological: denies any other headache, dizziness, weakness  Psychiatric: Denies any other depression, anxiety   All other review of systems were otherwise negative   VITAL SIGNS:  BP 130/78   Pulse 80   Temp 97.9 F (36.6 C) (Skin)   Resp 13   Ht 5\' 7"  (1.702 m)   Wt 192 lb (87.1 kg)   LMP 08/31/2015   BMI 30.07 kg/m   PHYSICAL EXAM:  Constitutional:  -- Normal body habitus  -- Awake, alert, and oriented x3  Eyes:  -- Pupils equally round and reactive to light  -- No scleral icterus  Ear, nose, throat:  -- No jugular venous distension -- No nasal drainage, bleeding Pulmonary:  -- No crackles  -- Equal  breath sounds bilaterally -- Breathing non-labored at rest Cardiovascular:  -- S1, S2 present  -- No pericardial rubs  Gastrointestinal:  -- Abdomen soft and non-distended with mild-/moderate- RUQ abdominal tenderness to palpation, no guarding/rebound tenderness -- No abdominal masses appreciated, pulsatile or otherwise  Musculoskeletal and Integumentary:  -- Wounds or skin discoloration: None appreciated -- Extremities: B/L UE and LE FROM, hands and feet warm, no edema  Neurologic:  -- Motor function: Intact and symmetric -- Sensation: Intact and symmetric  Labs:  CBC Latest Ref Rng & Units 04/23/2018 10/05/2015 05/25/2015  WBC 4.0 - 10.5 K/uL 7.2 5.6 7.9  Hemoglobin 12.0 - 15.0 g/dL 05/27/2015 23.7 62.8  Hematocrit 36.0 - 46.0 % 42.3 41.5 37.3  Platelets 150 -  400 K/uL 347 287 279   CMP Latest Ref Rng & Units 04/23/2018 10/05/2015 05/25/2015  Glucose 70 - 99 mg/dL 709(G) 86 89  BUN 6 - 20 mg/dL 9 12 8   Creatinine 0.44 - 1.00 mg/dL 2.83 6.62)  Sodium 135 - 145 mmol/L 140 138 137  Potassium 3.5 - 5.1 mmol/L 3.3(L) 3.3(L) 3.7  Chloride 98 - 111 mmol/L 105 100(L) 106  CO2 22 - 32 mmol/L 26 28 25   Calcium 8.9 - 10.3 mg/dL 9.2 9.2 9.47(M)  Total Protein 6.5 - 8.1 g/dL 7.3 7.8 7.0  Total Bilirubin 0.3 - 1.2 mg/dL 0.5 ) 0.5  Alkaline Phos 38 - 126 U/L 93 86 78  AST 15 - 41 U/L 27 19 18   ALT 0 - 44 U/L 27 17 12(L)   Imaging studies:  Limited RUQ Abdominal Ultrasound (04/23/2018) The gallbladder is nondistended. A negative sonographic 5.0(P sign was reported by the sonographer. No cholelithiasis or pericholecystic fluid.  Common bile duct: Diameter 2 mm   Assessment/Plan:  52 y.o. female with symptoms clinically consistent with biliary disease, most likely biliary dyskinesia or symptomatic microcholelithiasis (biliary "sludge") in context of no cholelithiasis visualized on recent RUQ abdominal ultrasound, complicated by co-morbidities including HTN, osteoarthritis, and  chronic ongoing tobacco abuse (smoking).   - continue BID Zantac since it's reported to be working, may alternatively consider PPI (omeprazole)  - discussed with patient and her daughter options of further monitoring, dietary log, and HIDA vs cholecystectomy, and patient requests cholecystectomy, stating the symptoms discussed for biliary dyskinesia or symptomatic microcholelithiasis (biliary sludge) sound exactly like her symptoms  - will schedule for laparoscopic cholecystectomy, but immediate smoking cessation strongly encouraged and request pre-op medical evaluation considering SOB after ascending 1 flight of steps  - anticipate return to clinic 2 weeks following cholecystectomy  - instructed to call if any questions or concerns  All of the above recommendations were discussed with the patient and patient's family, and all of patient's and family's questions were answered to their expressed satisfaction.  Thank you for the opportunity to participate in this patient's care.  -- 04/25/2018 Eulah Pont, MD, RPVI [place]: [facility] General Surgery - Partnering for exceptional care. Office: [phone]

## 2018-05-13 ENCOUNTER — Encounter
Admission: RE | Admit: 2018-05-13 | Discharge: 2018-05-13 | Disposition: A | Discharge status: Home / Self Care | Payer: Medicaid Other | Source: Ambulatory Visit | Attending: Surgery | Admitting: Surgery

## 2018-05-13 ENCOUNTER — Other Ambulatory Visit: Payer: Self-pay

## 2018-05-13 HISTORY — DX: Gastro-esophageal reflux disease without esophagitis: K21.9

## 2018-05-13 NOTE — Patient Instructions (Signed)
Your procedure is scheduled on: 05/16/18 Report to Day Surgery. [facility] SECOND FLOOR To find out your arrival time please call [phone] between 1PM - 3PM on 05/15/18  Remember: Instructions that are not followed completely may result in serious medical risk,  up to and including death, or upon the discretion of your surgeon and anesthesiologist your  surgery may need to be rescheduled.     _X__ 1. Do not eat food after midnight the night before your procedure.                 No gum chewing or hard candies. You may drink clear liquids up to 2 hours                 before you are scheduled to arrive for your surgery- DO not drink clear                 liquids within 2 hours of the start of your surgery.                 Clear Liquids include:  water, apple juice without pulp, clear carbohydrate                 drink such as Clearfast of Gatorade, Black Coffee or Tea (Do not add                 anything to coffee or tea).  __X__2.  On the morning of surgery brush your teeth with toothpaste and water, you                may rinse your mouth with mouthwash if you wish.  Do not swallow any toothpaste of mouthwash.     _X__ 3.  No Alcohol for 24 hours before or after surgery.   _X__ 4.  Do Not Smoke or use e-cigarettes For 24 Hours Prior to Your Surgery.                 Do not use any chewable tobacco products for at least 6 hours prior to                 surgery.  ____  5.  Bring all medications with you on the day of surgery if instructed.   _X_  6.  Notify your doctor if there is any change in your medical condition      (cold, fever, infections).     Do not wear jewelry, make-up, hairpins, clips or nail polish. Do not wear lotions, powders, or perfumes. You may wear deodorant. Do not shave 48 hours prior to surgery. Men may shave face and neck. Do not bring valuables to the hospital.    [facility] is not responsible for any belongings or  valuables.  Contacts, dentures or bridgework may not be worn into surgery. Leave your suitcase in the car. After surgery it may be brought to your room. For patients admitted to the hospital, discharge time is determined by your treatment team.   Patients discharged the day of surgery will not be allowed to drive home.    __X__ Take these medicines the morning of surgery with A SIP OF WATER:    1. RANITIDINE  2.   3.   4.  5.  6.  ____ Fleet Enema (as directed)   ____ Use CHG Soap as directed  ____ Use inhalers on the day of surgery  ____ Stop metformin 2 days prior to surgery  ____ Take 1/2 of usual insulin dose the night before surgery. No insulin the morning          of surgery.   ____ Stop Coumadin/Plavix/aspirin on _X___ Stop Anti-inflammatories on        STOP IBUPROFEN UNTIL AFTER SURGERY   ____ Stop supplements until after surgery.    ____ Bring C-Pap to the hospital.

## 2018-05-15 ENCOUNTER — Telehealth: Payer: Self-pay

## 2018-05-15 NOTE — Telephone Encounter (Signed)
Patient called to reschedule her surgery scheduled for 05/16/18 with Dr Earlene Plater. She states that she has a head cold and does not want to do the surgery with this. She would like to reschedule for sometime in January. The patient is rescheduled for surgery at [facility] with Dr Earlene Plater on 06/13/18. She is aware to call the day before to get her arrival time. She is aware to let us know of any changes medically or with her medication. OR has been notified of date change.

## 2018-06-13 ENCOUNTER — Ambulatory Visit: Admission: RE | Admit: 2018-06-13 | Payer: Medicaid Other | Source: Ambulatory Visit | Admitting: Surgery

## 2018-06-13 ENCOUNTER — Encounter: Admission: RE | Payer: Self-pay | Source: Ambulatory Visit

## 2018-06-13 SURGERY — LAPAROSCOPIC CHOLECYSTECTOMY
Anesthesia: General

## 2018-06-26 ENCOUNTER — Telehealth: Payer: Self-pay | Admitting: *Deleted

## 2018-06-26 NOTE — Telephone Encounter (Signed)
-----   Message from [facility], LPN sent at [id number]/70/7867  2:29 PM EST ----- Regarding: surgery cancellation Morrie Sheldon with pre admit called and was asking if this patient will be rescheduled for surgery. She was scheduled for 06/13/18 but canceled. Not sure why as not documented.

## 2018-06-26 NOTE — Telephone Encounter (Signed)
Patient was contacted today and states she cancelled her surgery that was scheduled for 06-13-18 day prior to surgery with [facility]-no reason given.   The patient does wish to reschedule surgery for February 2020.  Patient's surgery to be scheduled for 07-21-18 at [facility] with Dr. Earlene Plater per patient's request.  She is aware she will be contacted if Dr. Earlene Plater would like to see her in the office prior to surgery for a pre-op visit; otherwise, history and physical will be updated the morning of procedure.   The patient is aware to call the office should she have further questions.   Patient verbalizes understanding.

## 2018-06-30 ENCOUNTER — Telehealth: Payer: Self-pay | Admitting: *Deleted

## 2018-06-30 NOTE — Telephone Encounter (Signed)
Patient contacted today and aware that surgery has been rescheduled to 07-21-18 at [facility].   Per Dr. Earlene Plater, patient will not require a pre-op visit with him. History and physical will be updated the morning of procedure.   Also, patient will not need a phone interview or pre-admit appointment per [facility]. Patient will need to follow pre-op instructions that were given on 05-13-18. Patient aware.   Patient was instructed to call the office should she have further questions. She verbalizes understanding.

## 2018-07-20 MED ORDER — CIPROFLOXACIN IN D5W 400 MG/200ML IV SOLN: 400.0000 mg | INTRAVENOUS | Status: DC

## 2018-07-21 ENCOUNTER — Telehealth: Payer: Self-pay | Admitting: *Deleted

## 2018-07-21 ENCOUNTER — Ambulatory Visit: Admission: RE | Admit: 2018-07-21 | Payer: Medicaid Other | Source: Home / Self Care | Admitting: Surgery

## 2018-07-21 ENCOUNTER — Encounter: Admission: RE | Payer: Self-pay | Source: Home / Self Care

## 2018-07-21 SURGERY — LAPAROSCOPIC CHOLECYSTECTOMY
Anesthesia: General

## 2018-07-21 MED ORDER — ACETAMINOPHEN 500 MG PO TABS: 1000.0000 mg | ORAL_TABLET | ORAL | Status: DC

## 2018-07-21 MED ORDER — CHLORHEXIDINE GLUCONATE CLOTH 2 % EX PADS: 6.0000 | MEDICATED_PAD | Freq: Once | CUTANEOUS | Status: DC

## 2018-07-21 MED ORDER — LACTATED RINGERS IV SOLN: INTRAVENOUS | Status: DC

## 2018-07-21 NOTE — Telephone Encounter (Signed)
Message left for patient to call the office.   Patient was cancelled off the surgery schedule for Dr. Earlene Plater today but I could not find any documentation as to why.   Secure chat message sent to Dr. Earlene Plater this morning. He states that the hospital was not able to reach patient Friday, Saturday, or Sunday to inform patient of arrival time for lap chole today.   Patient was originally scheduled for surgery in January but it ended up getting rescheduled to 07-21-18.  Dr. Earlene Plater notified that I was unable to reach patient today and message left.   Will await patient's call.

## 2018-07-31 ENCOUNTER — Telehealth: Payer: Self-pay | Admitting: *Deleted

## 2018-07-31 NOTE — Telephone Encounter (Signed)
Another message left for patient to call the office.

## 2018-08-07 ENCOUNTER — Telehealth: Payer: Self-pay | Admitting: *Deleted

## 2018-08-07 NOTE — Telephone Encounter (Signed)
Patient contacted today about rescheduling gallbladder surgery.   The patient states she is not ready to reschedule at this time but will call us back when she is ready. She may want to have surgery when her grand kids are on Spring break.

## 2018-12-29 ENCOUNTER — Other Ambulatory Visit: Payer: Self-pay | Admitting: Family Medicine

## 2018-12-29 DIAGNOSIS — Z1231 Encounter for screening mammogram for malignant neoplasm of breast: Secondary | ICD-10-CM

## 2019-02-10 ENCOUNTER — Ambulatory Visit
Admission: RE | Admit: 2019-02-10 | Discharge: 2019-02-10 | Disposition: A | Discharge status: Home / Self Care | Payer: Medicare Other | Source: Ambulatory Visit | Attending: Family Medicine | Admitting: Family Medicine

## 2019-02-10 DIAGNOSIS — Z1231 Encounter for screening mammogram for malignant neoplasm of breast: Secondary | ICD-10-CM | POA: Diagnosis present

## 2019-10-19 ENCOUNTER — Other Ambulatory Visit: Payer: Self-pay

## 2019-11-07 ENCOUNTER — Inpatient Hospital Stay
Admission: RE | Admit: 2019-11-07 | Discharge: 2019-11-07 | Disposition: A | Discharge status: Home / Self Care | Payer: Medicare Other | Source: Ambulatory Visit

## 2019-11-07 ENCOUNTER — Telehealth: Payer: Medicare Other | Admitting: Nurse Practitioner

## 2019-11-07 DIAGNOSIS — R05 Cough: Secondary | ICD-10-CM

## 2019-11-07 DIAGNOSIS — R059 Cough, unspecified: Secondary | ICD-10-CM

## 2019-11-07 MED ORDER — AZITHROMYCIN 250 MG PO TABS
ORAL_TABLET | ORAL | 0 refills | Status: DC
Start: 2019-11-07 — End: 2022-06-19

## 2019-11-07 NOTE — Progress Notes (Signed)
We are sorry that you are not feeling well.  Here is how we plan to help!  Based on your presentation I believe you most likely have A cough due to bacteria.  When patients have a fever and a productive cough with a change in color or increased sputum production, we are concerned about bacterial bronchitis.  If left untreated it can progress to pneumonia.  If your symptoms do not improve with your treatment plan it is important that you contact your provider.   I have prescribed Azithromyin 250 mg: two tablets now and then one tablet daily for 4 additonal days    In addition you may use A non-prescription cough medication called Mucinex DM: take 2 tablets every 12 hours.   From your responses in the eVisit questionnaire you describe inflammation in the upper respiratory tract which is causing a significant cough.  This is commonly called Bronchitis and has four common causes:    Allergies  Viral Infections  Acid Reflux  Bacterial Infection Allergies, viruses and acid reflux are treated by controlling symptoms or eliminating the cause. An example might be a cough caused by taking certain blood pressure medications. You stop the cough by changing the medication. Another example might be a cough caused by acid reflux. Controlling the reflux helps control the cough.  USE OF BRONCHODILATOR ("RESCUE") INHALERS: There is a risk from using your bronchodilator too frequently.  The risk is that over-reliance on a medication which only relaxes the muscles surrounding the breathing tubes can reduce the effectiveness of medications prescribed to reduce swelling and congestion of the tubes themselves.  Although you feel brief relief from the bronchodilator inhaler, your asthma may actually be worsening with the tubes becoming more swollen and filled with mucus.  This can delay other crucial treatments, such as oral steroid medications. If you need to use a bronchodilator inhaler daily, several times per day,  you should discuss this with your provider.  There are probably better treatments that could be used to keep your asthma under control.     HOME CARE . Only take medications as instructed by your medical team. . Complete the entire course of an antibiotic. . Drink plenty of fluids and get plenty of rest. . Avoid close contacts especially the very young and the elderly . Cover your mouth if you cough or cough into your sleeve. . Always remember to wash your hands . A steam or ultrasonic humidifier can help congestion.   GET HELP RIGHT AWAY IF: . You develop worsening fever. . You become short of breath . You cough up blood. . Your symptoms persist after you have completed your treatment plan MAKE SURE YOU   Understand these instructions.  Will watch your condition.  Will get help right away if you are not doing well or get worse.  Your e-visit answers were reviewed by a board certified advanced clinical practitioner to complete your personal care plan.  Depending on the condition, your plan could have included both over the counter or prescription medications. If there is a problem please reply  once you have received a response from your provider. Your safety is important to us.  If you have drug allergies check your prescription carefully.    You can use MyChart to ask questions about today's visit, request a non-urgent call back, or ask for a work or school excuse for 24 hours related to this e-Visit. If it has been greater than 24 hours you will need to   follow up with your provider, or enter a new e-Visit to address those concerns. You will get an e-mail in the next two days asking about your experience.  I hope that your e-visit has been valuable and will speed your recovery. Thank you for using e-visits.   5-10 minutes spent reviewing and documenting in chart.  

## 2020-01-07 ENCOUNTER — Other Ambulatory Visit (INDEPENDENT_AMBULATORY_CARE_PROVIDER_SITE_OTHER): Payer: Self-pay | Admitting: Nurse Practitioner

## 2020-01-07 ENCOUNTER — Ambulatory Visit (INDEPENDENT_AMBULATORY_CARE_PROVIDER_SITE_OTHER)
Admission: RE | Admit: 2020-01-07 | Discharge: 2020-01-07 | Disposition: A | Discharge status: Other Acute Inpatient Hospital | Payer: Medicare Other | Source: Ambulatory Visit

## 2020-01-07 ENCOUNTER — Other Ambulatory Visit: Payer: Self-pay | Admitting: Nurse Practitioner

## 2020-01-07 DIAGNOSIS — R079 Chest pain, unspecified: Secondary | ICD-10-CM | POA: Diagnosis not present

## 2020-01-07 DIAGNOSIS — R059 Cough, unspecified: Secondary | ICD-10-CM

## 2020-01-07 DIAGNOSIS — R05 Cough: Secondary | ICD-10-CM | POA: Diagnosis not present

## 2020-01-07 NOTE — Discharge Instructions (Addendum)
Go to the Emergency Department for evaluation of your chest pain and cough.

## 2020-01-07 NOTE — ED Provider Notes (Signed)
Virtual Visit via Video Note:  Sherry Wright  initiated request for Telemedicine visit with [facility] Urgent [facility] team. I connected with Sherry Wright  on 01/07/2020 at 9:28 AM  for a synchronized telemedicine visit using a video enabled HIPPA compliant telemedicine application. I verified that I am speaking with Sherry Wright  using two identifiers. Mickie Bail, NP  was physically located in a [facility] Urgent care site and CHARISSE WENDELL was located at a different location.   The limitations of evaluation and management by telemedicine as well as the availability of in-person appointments were discussed. Patient was informed that she  may incur a bill ( including co-pay) for this virtual visit encounter. Sherry Wright  expressed understanding and gave verbal consent to proceed with virtual visit.     History of Present Illness:Sherry Wright  is a 54 y.o. female presents for evaluation of cough productive of yellow-brown phlegm since last night.  She is concerned because she is having left side chest pain, which is worse with coughing.  The chest pain is under her left breast.  She denies fever, chills, shortness of breath, n/v/d, diaphoresis, focal weakness, or other symptoms.      Allergies  Allergen Reactions  . Penicillins Hives and Other (See Comments)    Has patient had a PCN reaction causing immediate rash, facial/tongue/throat swelling, SOB or lightheadedness with hypotension: yes Has patient had a PCN reaction causing severe rash involving mucus membranes or skin necrosis: no Has patient had a PCN reaction that required hospitalization no Has patient had a PCN reaction occurring within the last 10 years:no If all of the above answers are "NO", then may proceed with Cephalosporin use.   . Folic Acid Other (See Comments)    Upset Stomach  . Prednisone Palpitations and Other (See Comments)    Patient said that prednisone caused her heart to flutter when she was on this medication.      Past Medical History:  Diagnosis Date  . Arthritis   . GERD (gastroesophageal reflux disease)   . Hypertension      Social History   Tobacco Use  . Smoking status: Current Every Day Smoker    Packs/day: 1.00    Types: Cigarettes  . Smokeless tobacco: Never Used  Vaping Use  . Vaping Use: Never used  Substance Use Topics  . Alcohol use: No  . Drug use: No    ROS: as stated in HPI.  All other systems reviewed and negative.      Observations/Objective: Physical Exam  VITALS: Patient denies fever. GENERAL: Alert, appears well and in no acute distress. HEENT: Atraumatic. Oral mucosa appears moist. NECK: Normal movements of the head and neck. CARDIOPULMONARY: No increased WOB. Speaking in clear sentences. I:E ratio WNL.  MS: Moves all visible extremities without noticeable abnormality. PSYCH: Pleasant and cooperative, well-groomed. Speech normal rate and rhythm. Affect is appropriate. Insight and judgement are appropriate. Attention is focused, linear, and appropriate.  NEURO: CN grossly intact. Oriented as arrived to appointment on time with no prompting. Moves both UE equally.  SKIN: No obvious lesions, wounds, erythema, or cyanosis noted on face or hands.   Assessment and Plan:    ICD-10-CM   1. Chest pain, unspecified type  R07.9   2. Cough  R05        Follow Up Instructions: Instructed patient to go to the Emergency Department for evaluation of her chest pain and other symptoms.  Patient  agrees to plan of care.      I discussed the assessment and treatment plan with the patient. The patient was provided an opportunity to ask questions and all were answered. The patient agreed with the plan and demonstrated an understanding of the instructions.   The patient was advised to call back or seek an in-person evaluation if the symptoms worsen or if the condition fails to improve as anticipated.      Mickie Bail, NP  01/07/2020 9:28 AM         Mickie Bail, NP 01/07/20 [phone]

## 2020-01-08 ENCOUNTER — Ambulatory Visit: Payer: Self-pay

## 2020-01-19 ENCOUNTER — Other Ambulatory Visit: Payer: Self-pay | Admitting: Family Medicine

## 2020-01-19 DIAGNOSIS — Z1231 Encounter for screening mammogram for malignant neoplasm of breast: Secondary | ICD-10-CM

## 2020-02-11 ENCOUNTER — Other Ambulatory Visit: Payer: Self-pay

## 2020-02-11 ENCOUNTER — Ambulatory Visit
Admission: RE | Admit: 2020-02-11 | Discharge: 2020-02-11 | Disposition: A | Discharge status: Home / Self Care | Payer: Medicare Other | Source: Ambulatory Visit | Attending: Family Medicine | Admitting: Family Medicine

## 2020-02-11 DIAGNOSIS — Z1231 Encounter for screening mammogram for malignant neoplasm of breast: Secondary | ICD-10-CM | POA: Diagnosis present

## 2020-06-17 ENCOUNTER — Telehealth: Payer: Medicare Other | Admitting: Nurse Practitioner

## 2020-06-17 DIAGNOSIS — J029 Acute pharyngitis, unspecified: Secondary | ICD-10-CM | POA: Diagnosis not present

## 2020-06-17 MED ORDER — AZITHROMYCIN 250 MG PO TABS
ORAL_TABLET | ORAL | 0 refills | Status: DC
Start: 2020-06-17 — End: 2022-06-19

## 2020-06-17 NOTE — Progress Notes (Signed)

## 2020-09-12 ENCOUNTER — Telehealth: Payer: Self-pay | Admitting: *Deleted

## 2020-09-12 ENCOUNTER — Encounter: Payer: Self-pay | Admitting: *Deleted

## 2020-09-12 DIAGNOSIS — Z122 Encounter for screening for malignant neoplasm of respiratory organs: Secondary | ICD-10-CM

## 2020-09-12 DIAGNOSIS — F17200 Nicotine dependence, unspecified, uncomplicated: Secondary | ICD-10-CM

## 2020-09-12 DIAGNOSIS — Z87891 Personal history of nicotine dependence: Secondary | ICD-10-CM

## 2020-09-12 NOTE — Telephone Encounter (Signed)
Received referral for initial lung cancer screening scan. Contacted patient and obtained smoking history,(current, 55.5 pack year) as well as answering questions related to screening process. Patient denies signs of lung cancer such as weight loss or hemoptysis. Patient denies comorbidity that would prevent curative treatment if lung cancer were found. Patient is scheduled for shared decision making visit and CT scan on 09/21/20 at 1030am.

## 2020-09-21 ENCOUNTER — Other Ambulatory Visit: Payer: Self-pay

## 2020-09-21 ENCOUNTER — Inpatient Hospital Stay: Payer: Medicare Other | Attending: Hospice and Palliative Medicine | Admitting: Hospice and Palliative Medicine

## 2020-09-21 ENCOUNTER — Ambulatory Visit
Admission: RE | Admit: 2020-09-21 | Discharge: 2020-09-21 | Disposition: A | Discharge status: Home / Self Care | Payer: Medicare Other | Source: Ambulatory Visit | Attending: Oncology | Admitting: Oncology

## 2020-09-21 DIAGNOSIS — Z122 Encounter for screening for malignant neoplasm of respiratory organs: Secondary | ICD-10-CM

## 2020-09-21 DIAGNOSIS — Z87891 Personal history of nicotine dependence: Secondary | ICD-10-CM | POA: Diagnosis not present

## 2020-09-21 DIAGNOSIS — F17210 Nicotine dependence, cigarettes, uncomplicated: Secondary | ICD-10-CM | POA: Insufficient documentation

## 2020-09-21 DIAGNOSIS — F17200 Nicotine dependence, unspecified, uncomplicated: Secondary | ICD-10-CM

## 2020-09-21 DIAGNOSIS — J432 Centrilobular emphysema: Secondary | ICD-10-CM | POA: Insufficient documentation

## 2020-09-21 DIAGNOSIS — I700 Atherosclerosis of aorta: Secondary | ICD-10-CM | POA: Insufficient documentation

## 2020-09-21 DIAGNOSIS — I2510 Atherosclerotic heart disease of native coronary artery without angina pectoris: Secondary | ICD-10-CM | POA: Insufficient documentation

## 2020-09-21 NOTE — Progress Notes (Signed)
Virtual Visit via Video Note  I connected with@ on 09/21/20 at@ by a video enabled telemedicine application and verified that I am speaking with the correct person using two identifiers.   I discussed the limitations of evaluation and management by telemedicine and the availability of in person appointments. The patient expressed understanding and agreed to proceed.  In accordance with CMS guidelines, patient has met eligibility criteria including age, absence of signs or symptoms of lung cancer.  Social History   Tobacco Use  . Smoking status: Current Every Day Smoker    Packs/day: 1.50    Years: 37.00    Pack years: 55.50    Types: Cigarettes  . Smokeless tobacco: Never Used  Vaping Use  . Vaping Use: Never used  Substance Use Topics  . Alcohol use: No  . Drug use: No      A shared decision-making session was conducted prior to the performance of CT scan. This includes one or more decision aids, includes benefits and harms of screening, follow-up diagnostic testing, over-diagnosis, false positive rate, and total radiation exposure.   Counseling on the importance of adherence to annual lung cancer LDCT screening, impact of co-morbidities, and ability or willingness to undergo diagnosis and treatment is imperative for compliance of the program.   Counseling on the importance of continued smoking cessation for former smokers; the importance of smoking cessation for current smokers, and information about tobacco cessation interventions have been given to patient including [place] and [facility] quit [place] programs.   Written order for lung cancer screening with LDCT has been given to the patient and any and all questions have been answered to the best of my abilities.    Yearly follow up will be coordinated by Burgess Estelle, Thoracic Navigator.  Time Total: 15 minutes  Visit consisted of counseling and education dealing with complex health screening. Greater than 50%  of this  time was spent counseling and coordinating care related to the above assessment and plan.  Signed by: Altha Harm, PhD, NP-C

## 2020-09-23 ENCOUNTER — Telehealth: Payer: Self-pay | Admitting: *Deleted

## 2020-09-23 NOTE — Telephone Encounter (Signed)
Notified patient of LDCT lung cancer screening program results with recommendation for 12 month follow up imaging. Also notified of incidental findings noted below and is encouraged to discuss further with PCP who will receive a copy of this note and/or the CT report. Patient verbalizes understanding.   IMPRESSION: 1. Lung-RADS 2S, benign appearance or behavior. Continue annual screening with low-dose chest CT without contrast in 12 months. 2. The "S" modifier above refers to potentially clinically significant non lung cancer related findings. Specifically, there is aortic atherosclerosis, in addition to 3 vessel coronary artery disease. Please note that although the presence of coronary artery calcium documents the presence of coronary artery disease, the severity of this disease and any potential stenosis cannot be assessed on this non-gated CT examination. Assessment for potential risk factor modification, dietary therapy or pharmacologic therapy may be warranted, if clinically indicated. 3. Mild diffuse bronchial wall thickening with very mild centrilobular and paraseptal emphysema; imaging findings suggestive of underlying COPD. 4. There are calcifications of the aortic valve. Echocardiographic correlation for evaluation of potential valvular dysfunction may be warranted if clinically indicated.  Aortic Atherosclerosis (ICD10-I70.0) and Emphysema (ICD10-J43.9).

## 2021-04-25 ENCOUNTER — Other Ambulatory Visit: Payer: Self-pay | Admitting: Family Medicine

## 2021-04-25 DIAGNOSIS — Z1231 Encounter for screening mammogram for malignant neoplasm of breast: Secondary | ICD-10-CM

## 2021-05-20 ENCOUNTER — Telehealth: Payer: Medicare Other | Admitting: Emergency Medicine

## 2021-05-20 DIAGNOSIS — R6889 Other general symptoms and signs: Secondary | ICD-10-CM | POA: Diagnosis not present

## 2021-05-20 MED ORDER — OSELTAMIVIR PHOSPHATE 75 MG PO CAPS: 75.0000 mg | ORAL_CAPSULE | Freq: Two times a day (BID) | ORAL | 0 refills | Status: AC

## 2021-05-20 NOTE — Progress Notes (Signed)
I have spent 5 minutes in review of e-visit questionnaire, review and updating patient chart, medical decision making and response to patient.   Zaydrian Batta, PA-C    

## 2021-05-20 NOTE — Progress Notes (Signed)

## 2021-05-22 ENCOUNTER — Telehealth: Payer: Medicare Other | Admitting: Physician Assistant

## 2021-05-22 DIAGNOSIS — R059 Cough, unspecified: Secondary | ICD-10-CM

## 2021-05-22 MED ORDER — BENZONATATE 100 MG PO CAPS: 100.0000 mg | ORAL_CAPSULE | Freq: Three times a day (TID) | ORAL | 0 refills | Status: DC | PRN

## 2021-05-22 NOTE — Addendum Note (Signed)
Addended by: Jannifer Rodney A on: 05/22/2021 04:43 PM   Modules accepted: Orders

## 2021-05-22 NOTE — Progress Notes (Signed)

## 2021-11-02 ENCOUNTER — Ambulatory Visit
Admission: RE | Admit: 2021-11-02 | Discharge: 2021-11-02 | Disposition: A | Discharge status: Home / Self Care | Payer: Medicare Other | Source: Ambulatory Visit | Attending: Family Medicine | Admitting: Family Medicine

## 2021-11-02 DIAGNOSIS — Z1231 Encounter for screening mammogram for malignant neoplasm of breast: Secondary | ICD-10-CM | POA: Diagnosis present

## 2021-11-11 ENCOUNTER — Telehealth: Payer: Medicare Other | Admitting: Family Medicine

## 2021-11-11 DIAGNOSIS — K219 Gastro-esophageal reflux disease without esophagitis: Secondary | ICD-10-CM

## 2021-11-11 MED ORDER — OMEPRAZOLE 40 MG PO CPDR: 40.0000 mg | DELAYED_RELEASE_CAPSULE | Freq: Every day | ORAL | 0 refills | Status: DC

## 2021-11-11 NOTE — Progress Notes (Signed)
E-Visit for Heartburn  We are sorry that you are not feeling well.  Here is how we plan to help!  Based on what you shared with me it looks like you most likely have Gastroesophageal Reflux Disease (GERD)  Gastroesophageal reflux disease (GERD) happens when acid from your stomach flows up into the esophagus.  When acid comes in contact with the esophagus, the acid causes sorenss (inflammation) in the esophagus.  Over time, GERD may create small holes (ulcers) in the lining of the esophagus.  I have prescribed omeprazole 40 mg po daily until you can see your pcp.   Your symptoms should improve in the next day or two.  You can use antacids as needed until symptoms resolve.  Call us if your heartburn worsens, you have trouble swallowing, weight loss, spitting up blood or recurrent vomiting.  Home Care: May include lifestyle changes such as weight loss, quitting smoking and alcohol consumption Avoid foods and drinks that make your symptoms worse, such as: Caffeine or alcoholic drinks Chocolate Peppermint or mint flavorings Garlic and onions Spicy foods Citrus fruits, such as oranges, lemons, or limes Tomato-based foods such as sauce, chili, salsa and pizza Fried and fatty foods Avoid lying down for 3 hours prior to your bedtime or prior to taking a nap Eat small, frequent meals instead of a large meals Wear loose-fitting clothing.  Do not wear anything tight around your waist that causes pressure on your stomach. Raise the head of your bed 6 to 8 inches with wood blocks to help you sleep.  Extra pillows will not help.  Seek Help Right Away If: You have pain in your arms, neck, jaw, teeth or back Your pain increases or changes in intensity or duration You develop nausea, vomiting or sweating (diaphoresis) You develop shortness of breath or you faint Your vomit is green, yellow, black or looks like coffee grounds or blood Your stool is red, bloody or black  These symptoms could be signs  of other problems, such as heart disease, gastric bleeding or esophageal bleeding.  Make sure you : Understand these instructions. Will watch your condition. Will get help right away if you are not doing well or get worse.  Your e-visit answers were reviewed by a board certified advanced clinical practitioner to complete your personal care plan.  Depending on the condition, your plan could have included both over the counter or prescription medications.  If there is a problem please reply  once you have received a response from your provider.  Your safety is important to us.  If you have drug allergies check your prescription carefully.    You can use MyChart to ask questions about today's visit, request a non-urgent call back, or ask for a work or school excuse for 24 hours related to this e-Visit. If it has been greater than 24 hours you will need to follow up with your provider, or enter a new e-Visit to address those concerns.  You will get an e-mail in the next two days asking about your experience.  I hope that your e-visit has been valuable and will speed your recovery. Thank you for using e-visits.  I have provided 5 minutes of non face to face time during this encounter for chart review and documentation.

## 2021-11-18 ENCOUNTER — Telehealth: Payer: Self-pay | Admitting: Acute Care

## 2021-11-18 NOTE — Telephone Encounter (Signed)
Attempted to reach pt to schedule annual LDCT-LVMM 

## 2021-11-21 ENCOUNTER — Ambulatory Visit: Payer: Medicare Other

## 2021-11-21 ENCOUNTER — Telehealth: Payer: Medicare Other | Admitting: Physician Assistant

## 2021-11-21 DIAGNOSIS — R399 Unspecified symptoms and signs involving the genitourinary system: Secondary | ICD-10-CM

## 2021-11-21 DIAGNOSIS — R102 Pelvic and perineal pain: Secondary | ICD-10-CM

## 2021-11-21 NOTE — Progress Notes (Signed)
Because of pelvic pain (possibly from ovaries) in addition to urinary symptoms, I feel your condition warrants further evaluation and I recommend that you be seen in a face to face visit. You need a detailed examination and urinary testing to make sure nothing more significant present and so the proper care can be given.    NOTE: There will be NO CHARGE for this eVisit   If you are having a true medical emergency please call 911.      For an urgent face to face visit, [place] has six urgent care centers for your convenience:     [facility] at [facility] Directions [phone] [address] [place], [place] [address]    [facility] Health Urgent Care Center [facility]) Get Driving Directions [phone] [address] [place], [place] [address]  [facility] Health Urgent Care Center [facility]) Get Driving Directions [phone] [address] [place],  [place]  [address]  [facility] Health Urgent Care at [facility] Get Driving Directions [phone] [address] [place], [place] [address]   [facility] at [facility] Get Driving Directions  [phone] [address] [place], [place] [address]   [facility] Health Urgent Care at [facility] Directions [phone] [address] [place], [place] [address]  Your MyChart E-visit questionnaire answers were reviewed by a board certified advanced clinical practitioner to complete your personal care plan based on your specific symptoms.  Thank you for using e-Visits.

## 2021-11-21 NOTE — Progress Notes (Signed)
Message sent to patient requesting further input regarding current symptoms. Awaiting patient response.  

## 2022-02-26 ENCOUNTER — Encounter: Payer: Self-pay | Admitting: *Deleted

## 2022-02-26 ENCOUNTER — Emergency Department
Admission: EM | Admit: 2022-02-26 | Discharge: 2022-02-26 | Discharge status: Against Medical Advice | Payer: Medicare Other | Attending: Student in an Organized Health Care Education/Training Program | Admitting: Student in an Organized Health Care Education/Training Program

## 2022-02-26 ENCOUNTER — Emergency Department: Payer: Medicare Other

## 2022-02-26 DIAGNOSIS — R0789 Other chest pain: Secondary | ICD-10-CM | POA: Diagnosis present

## 2022-02-26 DIAGNOSIS — Z5321 Procedure and treatment not carried out due to patient leaving prior to being seen by health care provider: Secondary | ICD-10-CM | POA: Diagnosis not present

## 2022-02-26 LAB — CBC
HCT: 39.6 % (ref 36.0–46.0)
Hemoglobin: 13.4 g/dL (ref 12.0–15.0)
MCH: 29.1 pg (ref 26.0–34.0)
MCHC: 33.8 g/dL (ref 30.0–36.0)
MCV: 86.1 fL (ref 80.0–100.0)
Platelets: 240 10*3/uL (ref 150–400)
RBC: 4.6 MIL/uL (ref 3.87–5.11)
RDW: 12.6 % (ref 11.5–15.5)
WBC: 7.5 10*3/uL (ref 4.0–10.5)
nRBC: 0 % (ref 0.0–0.2)

## 2022-02-26 LAB — BASIC METABOLIC PANEL
Anion gap: 10 (ref 5–15)
BUN: 14 mg/dL (ref 6–20)
CO2: 25 mmol/L (ref 22–32)
Calcium: 9.2 mg/dL (ref 8.9–10.3)
Chloride: 104 mmol/L (ref 98–111)
Creatinine, Ser: 0.7 mg/dL (ref 0.44–1.00)
GFR, Estimated: >60 mL/min (ref >60)
Glucose, Bld: 109 mg/dL — ABNORMAL HIGH (ref 70–99)
Potassium: 3.4 mmol/L — ABNORMAL LOW (ref 3.5–5.1)
Sodium: 139 mmol/L (ref 135–145)

## 2022-02-26 LAB — TROPONIN I (HIGH SENSITIVITY): Troponin I (High Sensitivity): 5 ng/L (ref <18)

## 2022-02-26 NOTE — ED Notes (Signed)
No answer when called several times from lobby 

## 2022-02-26 NOTE — ED Notes (Signed)
No answer when called several times from lobby; called pt on phone #listed in chart and pt st she is outside and will come back in

## 2022-02-26 NOTE — ED Triage Notes (Signed)
Pt has pain in left chest and left axilla since this am.  No sob .  No n/v  pt alert. Speech clear

## 2022-06-19 ENCOUNTER — Telehealth: Payer: 59 | Admitting: Physician Assistant

## 2022-06-19 DIAGNOSIS — J208 Acute bronchitis due to other specified organisms: Secondary | ICD-10-CM

## 2022-06-19 MED ORDER — BENZONATATE 100 MG PO CAPS: 100.0000 mg | ORAL_CAPSULE | Freq: Three times a day (TID) | ORAL | 0 refills | Status: DC | PRN

## 2022-06-19 NOTE — Progress Notes (Signed)
We are sorry that you are not feeling well.  Here is how we plan to help!  Based on your presentation I believe you most likely have A cough due to a virus.  This is called viral bronchitis and is best treated by rest, plenty of fluids and control of the cough.  You may use Ibuprofen or Tylenol as directed to help your symptoms.     In addition you may use A non-prescription cough medication called Mucinex DM: take 2 tablets every 12 hours. and A prescription cough medication called Tessalon Perles 100mg . You may take 1-2 capsules every 8 hours as needed for your cough. You can take these together.    From your responses in the eVisit questionnaire you describe inflammation in the upper respiratory tract which is causing a significant cough.  This is commonly called Bronchitis and has four common causes:   Allergies Viral Infections Acid Reflux Bacterial Infection Allergies, viruses and acid reflux are treated by controlling symptoms or eliminating the cause. An example might be a cough caused by taking certain blood pressure medications. You stop the cough by changing the medication. Another example might be a cough caused by acid reflux. Controlling the reflux helps control the cough.  USE OF BRONCHODILATOR ("RESCUE") INHALERS: There is a risk from using your bronchodilator too frequently.  The risk is that over-reliance on a medication which only relaxes the muscles surrounding the breathing tubes can reduce the effectiveness of medications prescribed to reduce swelling and congestion of the tubes themselves.  Although you feel brief relief from the bronchodilator inhaler, your asthma may actually be worsening with the tubes becoming more swollen and filled with mucus.  This can delay other crucial treatments, such as oral steroid medications. If you need to use a bronchodilator inhaler daily, several times per day, you should discuss this with your provider.  There are probably better treatments  that could be used to keep your asthma under control.     HOME CARE Only take medications as instructed by your medical team. Complete the entire course of an antibiotic. Drink plenty of fluids and get plenty of rest. Avoid close contacts especially the very young and the elderly Cover your mouth if you cough or cough into your sleeve. Always remember to wash your hands A steam or ultrasonic humidifier can help congestion.   GET HELP RIGHT AWAY IF: You develop worsening fever. You become short of breath You cough up blood. Your symptoms persist after you have completed your treatment plan MAKE SURE YOU  Understand these instructions. Will watch your condition. Will get help right away if you are not doing well or get worse.    Thank you for choosing an e-visit.  Your e-visit answers were reviewed by a board certified advanced clinical practitioner to complete your personal care plan. Depending upon the condition, your plan could have included both over the counter or prescription medications.  Please review your pharmacy choice. Make sure the pharmacy is open so you can pick up prescription now. If there is a problem, you may contact your provider through [employer] and have the prescription routed to another pharmacy.  Your safety is important to [place]. If you have drug allergies check your prescription carefully.   For the next 24 hours you can use MyChart to ask questions about today's visit, request a non-urgent call back, or ask for a work or school excuse. You will get an email in the next two days asking about  your experience. I hope that your e-visit has been valuable and will speed your recovery.  I have spent 5 minutes in review of e-visit questionnaire, review and updating patient chart, medical decision making and response to patient.   Margaretann Loveless, PA-C

## 2022-06-20 ENCOUNTER — Telehealth: Payer: Medicare Other | Admitting: Family

## 2022-06-20 DIAGNOSIS — Z20818 Contact with and (suspected) exposure to other bacterial communicable diseases: Secondary | ICD-10-CM

## 2022-06-20 DIAGNOSIS — J029 Acute pharyngitis, unspecified: Secondary | ICD-10-CM

## 2022-06-20 MED ORDER — CLINDAMYCIN HCL 300 MG PO CAPS: 300.0000 mg | ORAL_CAPSULE | Freq: Three times a day (TID) | ORAL | 0 refills | Status: DC

## 2022-06-20 NOTE — Progress Notes (Signed)
E-Visit for Sore Throat - Strep Symptoms  We are sorry that you are not feeling well.  Here is how we plan to help!  Based on what you have shared with me it is likely that you have strep pharyngitis.  Strep pharyngitis is inflammation and infection in the back of the throat.  This is an infection cause by bacteria and is treated with antibiotics.  I have prescribed Clindamycin 300 mg three times a day for 10 days. For throat pain, we recommend over the counter oral pain relief medications such as acetaminophen or aspirin, or anti-inflammatory medications such as ibuprofen or naproxen sodium. Topical treatments such as oral throat lozenges or sprays may be used as needed. Strep infections are not as easily transmitted as other respiratory infections, however we still recommend that you avoid close contact with loved ones, especially the very young and elderly.  Remember to wash your hands thoroughly throughout the day as this is the number one way to prevent the spread of infection and wipe down door knobs and counters with disinfectant.   Home Care: Only take medications as instructed by your medical team. Complete the entire course of an antibiotic. Do not take these medications with alcohol. A steam or ultrasonic humidifier can help congestion.  You can place a towel over your head and breathe in the steam from hot water coming from a faucet. Avoid close contacts especially the very young and the elderly. Cover your mouth when you cough or sneeze. Always remember to wash your hands.  Get Help Right Away If: You develop worsening fever or sinus pain. You develop a severe head ache or visual changes. Your symptoms persist after you have completed your treatment plan.  Make sure you Understand these instructions. Will watch your condition. Will get help right away if you are not doing well or get worse.   Thank you for choosing an e-visit.  Your e-visit answers were reviewed by a board  certified advanced clinical practitioner to complete your personal care plan. Depending upon the condition, your plan could have included both over the counter or prescription medications.  Please review your pharmacy choice. Make sure the pharmacy is open so you can pick up prescription now. If there is a problem, you may contact your provider through MyChart messaging and have the prescription routed to another pharmacy.  Your safety is important to us. If you have drug allergies check your prescription carefully.   For the next 24 hours you can use MyChart to ask questions about today's visit, request a non-urgent call back, or ask for a work or school excuse. You will get an email in the next two days asking about your experience. I hope that your e-visit has been valuable and will speed your recovery.  Approximately 5 minutes was spent documenting and reviewing patient's chart.    

## 2022-09-02 ENCOUNTER — Telehealth: Payer: 59 | Admitting: Family

## 2022-09-02 DIAGNOSIS — J069 Acute upper respiratory infection, unspecified: Secondary | ICD-10-CM

## 2022-09-02 MED ORDER — BENZONATATE 100 MG PO CAPS: 100.0000 mg | ORAL_CAPSULE | Freq: Three times a day (TID) | ORAL | 0 refills | Status: DC | PRN

## 2022-09-02 MED ORDER — FLUTICASONE PROPIONATE 50 MCG/ACT NA SUSP: 2.0000 | Freq: Every day | NASAL | 6 refills | Status: DC

## 2022-09-02 NOTE — Progress Notes (Signed)

## 2022-09-21 ENCOUNTER — Telehealth: Payer: 59 | Admitting: Emergency Medicine

## 2022-09-21 DIAGNOSIS — H9209 Otalgia, unspecified ear: Secondary | ICD-10-CM | POA: Diagnosis not present

## 2022-09-21 MED ORDER — AZITHROMYCIN 250 MG PO TABS: ORAL_TABLET | ORAL | 0 refills | Status: DC

## 2022-09-21 NOTE — Progress Notes (Signed)
E-Visit for Ear Pain - Acute Otitis Media   We are sorry that you are not feeling well. Here is how we plan to help!  Based on what you have shared with me it looks like you have Acute Otitis Media.  Acute Otitis Media is an infection of the middle or "inner" ear. This type of infection can cause redness, inflammation, and fluid buildup behind the tympanic membrane (ear drum).  The usual symptoms include: Earache/Pain Fever Upper respiratory symptoms Lack of energy/Fatigue/Malaise Slight hearing loss gradually worsening- if the inner ear fills with fluid What causes middle ear infections? Most middle ear infections occur when an infection such as a cold, leads to a build-up of mucus in the middle ear and causes the Eustachian tube (a thin tube that runs from the middle ear to the back of the nose) to become swollen or blocked.   This means mucus can't drain away properly, making it easier for an infection to spread into the middle ear.  How middle ear infections are treated: Most ear infections clear up within three to five days and don't need any specific treatment. If necessary, tylenol or ibuprofen should be used to relieve pain and a high temperature.  If you develop a fever higher than 102, or any significantly worsening symptoms, this could indicate a more serious infection moving to the middle/inner and needs face to face evaluation in an office by a provider.   Antibiotics aren't routinely used to treat middle ear infections, although they may occasionally be prescribed if symptoms persist or are particularly severe. Given your presentation,   I have prescribed Azithromycin 250 mg two tablets by mouth on day 1, then 1 tablet by mouth daily until completed     Your symptoms should improve over the next 3 days and should resolve in about 7 days. Be sure to complete ALL of the prescription(s) given.  HOME CARE: Wash your hands frequently. If you are prescribed an ear drop, do not  place the tip of the bottle on your ear or touch it with your fingers. You can take Acetaminophen 650 mg every 4-6 hours as needed for pain.  If pain is severe or moderate, you can apply a heating pad (set on low) or hot water bottle (wrapped in a towel) to outer ear for 20 minutes.  This will also increase drainage.  GET HELP RIGHT AWAY IF: Fever is over 102.2 degrees. You develop progressive ear pain or hearing loss. Ear symptoms persist longer than 3 days after treatment.  MAKE SURE YOU: Understand these instructions. Will watch your condition. Will get help right away if you are not doing well or get worse.  Thank you for choosing an e-visit.  Your e-visit answers were reviewed by a board certified advanced clinical practitioner to complete your personal care plan. Depending upon the condition, your plan could have included both over the counter or prescription medications.  Please review your pharmacy choice. Make sure the pharmacy is open so you can pick up the prescription now. If there is a problem, you may contact your provider through [employer] and have the prescription routed to another pharmacy.  Your safety is important to [place]. If you have drug allergies check your prescription carefully.   For the next 24 hours you can use MyChart to ask questions about today's visit, request a non-urgent call back, or ask for a work or school excuse. You will get an email with a survey after your eVisit asking  about your experience. We would appreciate your feedback. I hope that your e-visit has been valuable and will aid in your recovery.  Approximately 5 minutes was used in reviewing the patient's chart, questionnaire, prescribing medications, and documentation.

## 2022-10-19 ENCOUNTER — Telehealth: Payer: 59 | Admitting: Emergency Medicine

## 2022-10-19 DIAGNOSIS — R109 Unspecified abdominal pain: Secondary | ICD-10-CM

## 2022-10-19 NOTE — Progress Notes (Signed)
Based on what you shared with me, I feel your condition warrants further evaluation as soon as possible at an Emergency department. The pain you describe could be a kidney stone.  You may need lab work, imaging, and a urinalysis.   NOTE: There will be NO CHARGE for this eVisit   If you are having a true medical emergency please call 911.      Emergency Department-[facility]  Get Driving Directions  [phone]  [address]  [place], [place] [address]  Open 24/7/365      [facility] Emergency Department at [facility]  Get Driving Directions  [address]  [place], [place] [address]  Open 24/7/365    Emergency Department- [facility]  Get Driving Directions  [phone]  [address]  [place], [place] [address]  Open 24/7/365      [facility]  Get Driving Directions  [phone]  [address]  [place], [place] [address]  Open 24/7/365    [facility]  Emergency Department- [facility]  Get Driving Directions  [phone]  [address]  [place], [place] [address]  Open 24/7/365    [place]  Emergency Department- [facility]  Get Driving Directions  [address]  [place], [place] [address]  Open 24/7/365    [facility]  Emergency Department- [facility]  Get Driving Directions  [phone]  [address]  [place], [place] [address]  Open 24/7/365    Approximately 5 minutes was used in reviewing the patient's chart, questionnaire, prescribing medications, and documentation.

## 2022-12-04 ENCOUNTER — Telehealth: Payer: 59 | Admitting: Physician Assistant

## 2022-12-04 DIAGNOSIS — J302 Other seasonal allergic rhinitis: Secondary | ICD-10-CM

## 2022-12-04 DIAGNOSIS — H1013 Acute atopic conjunctivitis, bilateral: Secondary | ICD-10-CM

## 2022-12-04 MED ORDER — AZELASTINE HCL 0.1 % NA SOLN: 1.0000 | Freq: Two times a day (BID) | NASAL | 0 refills | Status: AC

## 2022-12-04 NOTE — Progress Notes (Signed)
I have spent 5 minutes in review of e-visit questionnaire, review and updating patient chart, medical decision making and response to patient.   Lexton Hidalgo Cody Raylee Strehl, PA-C    

## 2022-12-04 NOTE — Progress Notes (Signed)
E visit for Allergic Rhinitis We are sorry that you are not feeling well.  Here is how we plan to help!  Based on what you have shared with me it looks like you have Allergic Rhinitis.  Rhinitis is when a reaction occurs that causes nasal congestion, runny nose, sneezing, and itching.  Most types of rhinitis are caused by an inflammation and are associated with symptoms in the eyes ears or throat. There are several types of rhinitis.  The most common are acute rhinitis, which is usually caused by a viral illness, allergic or seasonal rhinitis, and nonallergic or year-round rhinitis.  Nasal allergies occur certain times of the year.  Allergic rhinitis is caused when allergens in the air trigger the release of histamine in the body.  Histamine causes itching, swelling, and fluid to build up in the fragile linings of the nasal passages, sinuses and eyelids.  An itchy nose and clear discharge are common.  I recommend the following over the counter treatments: You should take a daily dose of antihistamine and Xyzal 5 mg take 1 tablet daily  I also would recommend a nasal spray: Flonase 2 sprays into each nostril once daily - ok to keep up with this  I have sent in a prescription for astelin eye drops to use as directed.  HOME CARE:  You can use an over-the-counter saline nasal spray as needed Avoid areas where there is heavy dust, mites, or molds Stay indoors on windy days during the pollen season Keep windows closed in home, at least in bedroom; use air conditioner. Use high-efficiency house air filter Keep windows closed in car, turn AC on re-circulate Avoid playing out with dog during pollen season  GET HELP RIGHT AWAY IF:  If your symptoms do not improve within 10 days You become short of breath You develop yellow or green discharge from your nose for over 3 days You have coughing fits  MAKE SURE YOU:  Understand these instructions Will watch your condition Will get help right away  if you are not doing well or get worse  Thank you for choosing an e-visit. Your e-visit answers were reviewed by a board certified advanced clinical practitioner to complete your personal care plan. Depending upon the condition, your plan could have included both over the counter or prescription medications. Please review your pharmacy choice. Be sure that the pharmacy you have chosen is open so that you can pick up your prescription now.  If there is a problem you may message your provider in MyChart to have the prescription routed to another pharmacy. Your safety is important to [place]. If you have drug allergies check your prescription carefully.  For the next 24 hours, you can use MyChart to ask questions about today's visit, request a non-urgent call back, or ask for a work or school excuse from your e-visit provider. You will get an email in the next two days asking about your experience. I hope that your e-visit has been valuable and will speed your recovery.

## 2022-12-18 ENCOUNTER — Other Ambulatory Visit: Payer: Self-pay | Admitting: Family Medicine

## 2022-12-18 DIAGNOSIS — Z1231 Encounter for screening mammogram for malignant neoplasm of breast: Secondary | ICD-10-CM

## 2022-12-20 ENCOUNTER — Ambulatory Visit
Admission: RE | Admit: 2022-12-20 | Discharge: 2022-12-20 | Disposition: A | Discharge status: Home / Self Care | Payer: 59 | Source: Ambulatory Visit | Attending: Family Medicine | Admitting: Family Medicine

## 2022-12-20 DIAGNOSIS — Z1231 Encounter for screening mammogram for malignant neoplasm of breast: Secondary | ICD-10-CM | POA: Insufficient documentation

## 2022-12-26 ENCOUNTER — Encounter (INDEPENDENT_AMBULATORY_CARE_PROVIDER_SITE_OTHER): Payer: Self-pay

## 2023-01-15 ENCOUNTER — Other Ambulatory Visit: Payer: Self-pay

## 2023-01-15 ENCOUNTER — Telehealth: Payer: Self-pay

## 2023-01-15 DIAGNOSIS — Z1211 Encounter for screening for malignant neoplasm of colon: Secondary | ICD-10-CM

## 2023-01-15 MED ORDER — NA SULFATE-K SULFATE-MG SULF 17.5-3.13-1.6 GM/177ML PO SOLN: 1.0000 | Freq: Once | ORAL | 0 refills | Status: AC

## 2023-01-15 NOTE — Telephone Encounter (Signed)
Gastroenterology Pre-Procedure Review  Request Date: 02/20/23 Requesting Physician: Dr. Tobi Bastos  PATIENT REVIEW QUESTIONS: The patient responded to the following health history questions as indicated:    1. Are you having any GI issues? no 2. Do you have a personal history of Polyps? no 3. Do you have a family history of Colon Cancer or Polyps? Grandfather colon polyps 4. Diabetes Mellitus? no 5. Joint replacements in the past 12 months?no 6. Major health problems in the past 3 months?no 7. Any artificial heart valves, MVP, or defibrillator?no    MEDICATIONS & ALLERGIES:    Patient reports the following regarding taking any anticoagulation/antiplatelet therapy:   Plavix, Coumadin, Eliquis, Xarelto, Lovenox, Pradaxa, Brilinta, or Effient? no Aspirin? no  Patient confirms/reports the following medications:  Current Outpatient Medications  Medication Sig Dispense Refill   azelastine (ASTELIN) 0.1 % nasal spray Place 1 spray into both nostrils 2 (two) times daily. Use in each nostril as directed 30 mL 0   azithromycin (ZITHROMAX) 250 MG tablet Take 2 tabs today, then take 1 tab daily until gone. 6 tablet 0   benzonatate (TESSALON PERLES) 100 MG capsule Take 1 capsule (100 mg total) by mouth 3 (three) times daily as needed. 20 capsule 0   clindamycin (CLEOCIN) 300 MG capsule Take 1 capsule (300 mg total) by mouth 3 (three) times daily. 30 capsule 0   famotidine (PEPCID) 20 MG tablet Take 20 mg by mouth 2 (two) times daily.     fluticasone (FLONASE) 50 MCG/ACT nasal spray Place 2 sprays into both nostrils daily. 16 g 6   ibuprofen (ADVIL,MOTRIN) 800 MG tablet Take 800 mg by mouth every 8 (eight) hours as needed for headache or moderate pain.      lisinopril-hydrochlorothiazide (PRINZIDE,ZESTORETIC) 20-12.5 MG tablet Take 2 tablets by mouth daily.      omeprazole (PRILOSEC) 40 MG capsule Take 1 capsule (40 mg total) by mouth daily. 14 capsule 0   No current facility-administered medications  for this visit.    Patient confirms/reports the following allergies:  Allergies  Allergen Reactions   Penicillins Hives and Other (See Comments)    Has patient had a PCN reaction causing immediate rash, facial/tongue/throat swelling, SOB or lightheadedness with hypotension: yes Has patient had a PCN reaction causing severe rash involving mucus membranes or skin necrosis: no Has patient had a PCN reaction that required hospitalization no Has patient had a PCN reaction occurring within the last 10 years:no If all of the above answers are "NO", then may proceed with Cephalosporin use.    Folic Acid Other (See Comments)    Upset Stomach   Prednisone Palpitations and Other (See Comments)    Patient said that prednisone caused her heart to flutter when she was on this medication.    No orders of the defined types were placed in this encounter.   AUTHORIZATION INFORMATION Primary Insurance: 1D#: Group #:  Secondary Insurance: 1D#: Group #:  SCHEDULE INFORMATION: Date: 09/11 Time: Location: [facility]

## 2023-02-12 ENCOUNTER — Telehealth: Payer: 59 | Admitting: Physician Assistant

## 2023-02-12 DIAGNOSIS — K047 Periapical abscess without sinus: Secondary | ICD-10-CM | POA: Diagnosis not present

## 2023-02-12 MED ORDER — CLINDAMYCIN HCL 300 MG PO CAPS: 300.0000 mg | ORAL_CAPSULE | Freq: Three times a day (TID) | ORAL | 0 refills | Status: AC

## 2023-02-12 NOTE — Progress Notes (Signed)
I have spent 5 minutes in review of e-visit questionnaire, review and updating patient chart, medical decision making and response to patient.   William Cody Martin, PA-C    

## 2023-02-12 NOTE — Progress Notes (Signed)

## 2023-02-19 ENCOUNTER — Encounter: Payer: Self-pay | Admitting: Gastroenterology

## 2023-02-20 ENCOUNTER — Encounter
Admission: RE | Disposition: A | Discharge status: Home / Self Care | Payer: Self-pay | Source: Home / Self Care | Attending: Gastroenterology

## 2023-02-20 ENCOUNTER — Ambulatory Visit
Admission: RE | Admit: 2023-02-20 | Discharge: 2023-02-20 | Disposition: A | Discharge status: Home / Self Care | Payer: 59 | Attending: Gastroenterology | Admitting: Gastroenterology

## 2023-02-20 ENCOUNTER — Ambulatory Visit: Payer: 59

## 2023-02-20 ENCOUNTER — Encounter: Payer: Self-pay | Admitting: Gastroenterology

## 2023-02-20 ENCOUNTER — Other Ambulatory Visit: Payer: Self-pay

## 2023-02-20 DIAGNOSIS — F17210 Nicotine dependence, cigarettes, uncomplicated: Secondary | ICD-10-CM | POA: Diagnosis not present

## 2023-02-20 DIAGNOSIS — D126 Benign neoplasm of colon, unspecified: Secondary | ICD-10-CM

## 2023-02-20 DIAGNOSIS — D125 Benign neoplasm of sigmoid colon: Secondary | ICD-10-CM | POA: Insufficient documentation

## 2023-02-20 DIAGNOSIS — Z79899 Other long term (current) drug therapy: Secondary | ICD-10-CM | POA: Diagnosis not present

## 2023-02-20 DIAGNOSIS — K5730 Diverticulosis of large intestine without perforation or abscess without bleeding: Secondary | ICD-10-CM | POA: Insufficient documentation

## 2023-02-20 DIAGNOSIS — I10 Essential (primary) hypertension: Secondary | ICD-10-CM | POA: Insufficient documentation

## 2023-02-20 DIAGNOSIS — Z1211 Encounter for screening for malignant neoplasm of colon: Secondary | ICD-10-CM

## 2023-02-20 DIAGNOSIS — K219 Gastro-esophageal reflux disease without esophagitis: Secondary | ICD-10-CM | POA: Insufficient documentation

## 2023-02-20 HISTORY — PX: POLYPECTOMY: SHX5525

## 2023-02-20 HISTORY — PX: COLONOSCOPY WITH PROPOFOL: SHX5780

## 2023-02-20 SURGERY — COLONOSCOPY WITH PROPOFOL
Anesthesia: General

## 2023-02-20 MED ORDER — SODIUM CHLORIDE 0.9 % IV SOLN: INTRAVENOUS | Status: DC

## 2023-02-20 MED ORDER — PROPOFOL 10 MG/ML IV BOLUS
INTRAVENOUS | Status: DC | PRN
  Administered 2023-02-20: 50 mg via INTRAVENOUS

## 2023-02-20 MED ORDER — LIDOCAINE HCL (PF) 2 % IJ SOLN
INTRAMUSCULAR | Status: AC
  Filled 2023-02-20: qty 5

## 2023-02-20 MED ORDER — PROPOFOL 500 MG/50ML IV EMUL
INTRAVENOUS | Status: DC | PRN
  Administered 2023-02-20: 75 ug/kg/min via INTRAVENOUS

## 2023-02-20 MED ORDER — LIDOCAINE HCL (CARDIAC) PF 100 MG/5ML IV SOSY
PREFILLED_SYRINGE | INTRAVENOUS | Status: DC | PRN
  Administered 2023-02-20: 100 mg via INTRAVENOUS

## 2023-02-20 NOTE — Op Note (Signed)
[facility] Gastroenterology Patient Name: Sherry Wright Procedure Date: 02/20/2023 11:20 AM MRN: [id number] Account #: [id number] Date of Birth: [birth year] Admit Type: Outpatient Age: 57 Room: [facility] ENDO ROOM 3 Gender: Female Note Status: Finalized Instrument Name: Prentice Docker [id number] Procedure:             Colonoscopy Indications:           Screening for colorectal malignant neoplasm Providers:             Wyline Mood MD, MD Referring MD:          Wyline Mood MD, MD (Referring MD), No Local Md, MD                         (Referring MD) Medicines:             Monitored Anesthesia Care Complications:         No immediate complications. Procedure:             Pre-Anesthesia Assessment:                        - Prior to the procedure, a History and Physical was                         performed, and patient medications, allergies and                         sensitivities were reviewed. The patient's tolerance                         of previous anesthesia was reviewed.                        - The risks and benefits of the procedure and the                         sedation options and risks were discussed with the                         patient. All questions were answered and informed                         consent was obtained.                        - ASA Grade Assessment: II - A patient with mild                         systemic disease.                        After obtaining informed consent, the colonoscope was                         passed under direct vision. Throughout the procedure,                         the patient's blood pressure, pulse, and oxygen                         saturations  were monitored continuously. The                         Colonoscope was introduced through the anus and                         advanced to the the cecum, identified by the                         appendiceal orifice. The colonoscopy was performed                          with ease. The patient tolerated the procedure well.                         The quality of the bowel preparation was good. The                         ileocecal valve, appendiceal orifice, and rectum were                         photographed. Findings:      The perianal and digital rectal examinations were normal.      A 5 mm polyp was found in the sigmoid colon. The polyp was sessile. The       polyp was removed with a cold snare. Resection and retrieval were       complete.      Multiple small-mouthed diverticula were found in the left colon.      The exam was otherwise without abnormality on direct and retroflexion       views. Impression:            - One 5 mm polyp in the sigmoid colon, removed with a                         cold snare. Resected and retrieved.                        - Diverticulosis in the left colon.                        - The examination was otherwise normal on direct and                         retroflexion views. Recommendation:        - Discharge patient to home (with escort).                        - Resume previous diet.                        - Continue present medications.                        - Await pathology results.                        - Repeat colonoscopy for surveillance based on  pathology results. Procedure Code(s):     --- Professional ---                        [phone], Colonoscopy, flexible; with removal of                         tumor(s), polyp(s), or other lesion(s) by snare                         technique Diagnosis Code(s):     --- Professional ---                        Z12.11, Encounter for screening for malignant neoplasm                         of colon                        D12.5, Benign neoplasm of sigmoid colon                        K57.30, Diverticulosis of large intestine without                         perforation or abscess without bleeding CPT copyright 2022 American [employer] Association. All  rights reserved. The codes documented in this report are preliminary and upon coder review may  be revised to meet current compliance requirements. Wyline Mood, MD Wyline Mood MD, MD 02/20/2023 12:41:04 PM This report has been signed electronically. Number of Addenda: 0 Note Initiated On: 02/20/2023 11:20 AM Scope Withdrawal Time: 0 hours 8 minutes 52 seconds  Total Procedure Duration: 0 hours 10 minutes 38 seconds  Estimated Blood Loss:  Estimated blood loss: none.      [facility]

## 2023-02-20 NOTE — Transfer of Care (Signed)
Immediate Anesthesia Transfer of Care Note  Patient: Sherry Wright  Procedure(s) Performed: COLONOSCOPY WITH PROPOFOL POLYPECTOMY  Patient Location: Endoscopy Unit  Anesthesia Type:General  Level of Consciousness: awake and patient cooperative  Airway & Oxygen Therapy: Patient Spontanous Breathing and Patient connected to face mask oxygen  Post-op Assessment: Report given to RN and Patient moving all extremities  Post vital signs: Reviewed and stable  Last Vitals:  Vitals Value Taken Time  BP 135/79 02/20/23 1246  Temp 36.4 C 02/20/23 1244  Pulse 65 02/20/23 1246  Resp 14 02/20/23 1246  SpO2 100 % 02/20/23 1246  Vitals shown include unfiled device data.  Last Pain:  Vitals:   02/20/23 1244  TempSrc:   PainSc: 0-No pain         Complications: No notable events documented.

## 2023-02-20 NOTE — Anesthesia Procedure Notes (Signed)
Procedure Name: General with mask airway Date/Time: 02/20/2023 12:27 PM  Performed by: Lily Lovings, CRNAPre-anesthesia Checklist: Patient identified and Emergency Drugs available Patient Re-evaluated:Patient Re-evaluated prior to induction Oxygen Delivery Method: Simple face mask Preoxygenation: Pre-oxygenation with 100% oxygen Induction Type: IV induction Comments: POM

## 2023-02-20 NOTE — Anesthesia Preprocedure Evaluation (Addendum)
Anesthesia Evaluation  Patient identified by MRN, date of birth, ID band Patient awake    Reviewed: Allergy & Precautions, NPO status , Patient's Chart, lab work & pertinent test results  History of Anesthesia Complications Negative for: history of anesthetic complications  Airway Mallampati: II  TM Distance: >3 FB Neck ROM: full    Dental  (+) Upper Dentures   Pulmonary Current Smoker   Pulmonary exam normal        Cardiovascular hypertension, On Medications Normal cardiovascular exam     Neuro/Psych negative neurological ROS  negative psych ROS   GI/Hepatic Neg liver ROS,GERD  Medicated,,  Endo/Other  negative endocrine ROS    Renal/GU negative Renal ROS  negative genitourinary   Musculoskeletal  (+) Arthritis ,    Abdominal   Peds  Hematology negative hematology ROS (+)   Anesthesia Other Findings Past Medical History: No date: Arthritis No date: GERD (gastroesophageal reflux disease) No date: Hypertension  Past Surgical History: No date: BRAIN SURGERY     Reproductive/Obstetrics negative OB ROS                             Anesthesia Physical Anesthesia Plan  ASA: 2  Anesthesia Plan: General   Post-op Pain Management: Minimal or no pain anticipated   Induction:   PONV Risk Score and Plan: 1 and Propofol infusion and TIVA  Airway Management Planned:   Additional Equipment:   Intra-op Plan:   Post-operative Plan:   Informed Consent: I have reviewed the patients History and Physical, chart, labs and discussed the procedure including the risks, benefits and alternatives for the proposed anesthesia with the patient or authorized representative who has indicated his/her understanding and acceptance.     Dental Advisory Given  Plan Discussed with: Anesthesiologist, CRNA and Surgeon  Anesthesia Plan Comments:         Anesthesia Quick Evaluation

## 2023-02-20 NOTE — H&P (Signed)
Wyline Mood, MD [address], [place], [place], [address] [address], [place], [place], [address] Phone: [phone]  Fax: [phone]  Primary Care Physician:  [facility]   Pre-Procedure History & Physical: HPI:  Sherry Wright is a 57 y.o. female is here for an colonoscopy.   Past Medical History:  Diagnosis Date   Arthritis    GERD (gastroesophageal reflux disease)    Hypertension     Past Surgical History:  Procedure Laterality Date   BRAIN SURGERY      Prior to Admission medications   Medication Sig Start Date End Date Taking? Authorizing Provider  azelastine (ASTELIN) 0.1 % nasal spray Place 1 spray into both nostrils 2 (two) times daily. Use in each nostril as directed 12/04/22   Waldon Merl, PA-C  azithromycin (ZITHROMAX) 250 MG tablet Take 2 tabs today, then take 1 tab daily until gone. 09/21/22   Roxy Horseman, PA-C  benzonatate (TESSALON PERLES) 100 MG capsule Take 1 capsule (100 mg total) by mouth 3 (three) times daily as needed. 09/02/22   Junie Spencer, FNP  famotidine (PEPCID) 20 MG tablet Take 20 mg by mouth 2 (two) times daily.    [provider]  fluticasone (FLONASE) 50 MCG/ACT nasal spray Place 2 sprays into both nostrils daily. 09/02/22   Junie Spencer, FNP  ibuprofen (ADVIL,MOTRIN) 800 MG tablet Take 800 mg by mouth every 8 (eight) hours as needed for headache or moderate pain.     [provider]  lisinopril-hydrochlorothiazide (PRINZIDE,ZESTORETIC) 20-12.5 MG tablet Take 2 tablets by mouth daily.     [provider]  omeprazole (PRILOSEC) 40 MG capsule Take 1 capsule (40 mg total) by mouth daily. 11/11/21   Delorse Lek, FNP    Allergies as of 01/15/2023 - Review Complete 01/15/2023  Allergen Reaction Noted   Penicillins Hives and Other (See Comments) 03/12/2015   Folic acid Other (See Comments) 11/12/2017   Prednisone Palpitations and Other (See Comments) 11/12/2017     Family History  Problem Relation Age of Onset   Breast cancer Mother 27       deceased   Breast cancer Maternal Aunt 30   Breast cancer Maternal Grandfather 51   Breast cancer Cousin 41    Social History   Socioeconomic History   Marital status: Widowed    Spouse name: Not on file   Number of children: Not on file   Years of education: Not on file   Highest education level: Not on file  Occupational History   Not on file  Tobacco Use   Smoking status: Every Day    Current packs/day: 1.50    Average packs/day: 1.5 packs/day for 37.0 years (55.5 ttl pk-yrs)    Types: Cigarettes   Smokeless tobacco: Never  Vaping Use   Vaping status: Never Used  Substance and Sexual Activity   Alcohol use: No   Drug use: No   Sexual activity: Not on file  Other Topics Concern   Not on file  Social History Narrative   Not on file   Social Determinants of Health   Financial Resource Strain: Not on file  Food Insecurity: Not on file  Transportation Needs: Not on file  Physical Activity: Not on file  Stress: Not on file  Social Connections: Not on file  Intimate Partner Violence: Not on file    Review of Systems: See HPI, otherwise negative ROS  Physical Exam: LMP  08/31/2015  General:   Alert,  pleasant and cooperative in NAD Head:  Normocephalic and atraumatic. Neck:  Supple; no masses or thyromegaly. Lungs:  Clear throughout to auscultation, normal respiratory effort.    Heart:  +S1, +S2, Regular rate and rhythm, No edema. Abdomen:  Soft, nontender and nondistended. Normal bowel sounds, without guarding, and without rebound.   Neurologic:  Alert and  oriented x4;  grossly normal neurologically.  Impression/Plan: Sherry Wright is here for an colonoscopy to be performed for Screening colonoscopy average risk   Risks, benefits, limitations, and alternatives regarding  colonoscopy have been reviewed with the patient.  Questions have been answered.  All parties  agreeable.   Wyline Mood, MD  02/20/2023, 11:19 AM

## 2023-02-20 NOTE — Anesthesia Postprocedure Evaluation (Signed)
Anesthesia Post Note  Patient: Sherry Wright  Procedure(s) Performed: COLONOSCOPY WITH PROPOFOL POLYPECTOMY  Patient location during evaluation: Endoscopy Anesthesia Type: General Level of consciousness: awake and alert Pain management: pain level controlled Vital Signs Assessment: post-procedure vital signs reviewed and stable Respiratory status: spontaneous breathing, nonlabored ventilation, respiratory function stable and patient connected to nasal cannula oxygen Cardiovascular status: blood pressure returned to baseline and stable Postop Assessment: no apparent nausea or vomiting Anesthetic complications: no   No notable events documented.   Last Vitals:  Vitals:   02/20/23 1244 02/20/23 1254  BP: 135/79 112/80  Pulse: 72 60  Resp: 20 12  Temp: 36.4 C   SpO2: 100% 100%    Last Pain:  Vitals:   02/20/23 1254  TempSrc:   PainSc: 0-No pain                 Louie Boston

## 2023-02-21 ENCOUNTER — Encounter: Payer: Self-pay | Admitting: Gastroenterology

## 2023-02-21 LAB — SURGICAL PATHOLOGY

## 2023-03-16 ENCOUNTER — Telehealth: Payer: 59 | Admitting: Nurse Practitioner

## 2023-03-16 DIAGNOSIS — J029 Acute pharyngitis, unspecified: Secondary | ICD-10-CM

## 2023-03-16 NOTE — Progress Notes (Signed)
E-Visit for Sore Throat  We are sorry that you are not feeling well.  Here is how we plan to help!  Providers prescribe antibiotics to treat infections caused by bacteria. Antibiotics are very powerful in treating bacterial infections when they are used properly. To maintain their effectiveness, they should be used only when necessary. Overuse of antibiotics has resulted in the development of superbugs that are resistant to treatment!    After careful review of your answers, I would not recommend an antibiotic for your condition.  Antibiotics are not effective against viruses and therefore should not be used to treat them. Common examples of infections caused by viruses include colds and flu   Your symptoms indicate a likely viral infection (Pharyngitis).   Pharyngitis is inflammation in the back of the throat which can cause a sore throat, scratchiness and sometimes difficulty swallowing.   Pharyngitis is typically caused by a respiratory virus and will just run its course.  Please keep in mind that your symptoms could last up to 10 days.  For throat pain, we recommend over the counter oral pain relief medications such as acetaminophen or aspirin, or anti-inflammatory medications such as ibuprofen or naproxen sodium.  Topical treatments such as oral throat lozenges or sprays may be used as needed.  Avoid close contact with loved ones, especially the very young and elderly.  Remember to wash your hands thoroughly throughout the day as this is the number one way to prevent the spread of infection and wipe down door knobs and counters with disinfectant.  After careful review of your answers, I would not recommend an antibiotic for your condition.  Antibiotics should not be used to treat conditions that we suspect are caused by viruses like the virus that causes the common cold or flu. However, some people can have Strep with atypical symptoms. You may need formal testing in clinic or office to confirm if  your symptoms continue or worsen.  Providers prescribe antibiotics to treat infections caused by bacteria. Antibiotics are very powerful in treating bacterial infections when they are used properly.  To maintain their effectiveness, they should be used only when necessary.  Overuse of antibiotics has resulted in the development of super bugs that are resistant to treatment!    Home Care: Only take medications as instructed by your medical team. Do not drink alcohol while taking these medications. A steam or ultrasonic humidifier can help congestion.  You can place a towel over your head and breathe in the steam from hot water coming from a faucet. Avoid close contacts especially the very young and the elderly. Cover your mouth when you cough or sneeze. Always remember to wash your hands.  Get Help Right Away If: You develop worsening fever or throat pain. You develop a severe head ache or visual changes. Your symptoms persist after you have completed your treatment plan.  Make sure you Understand these instructions. Will watch your condition. Will get help right away if you are not doing well or get worse.   Thank you for choosing an e-visit.  Your e-visit answers were reviewed by a board certified advanced clinical practitioner to complete your personal care plan. Depending upon the condition, your plan could have included both over the counter or prescription medications.  Please review your pharmacy choice. Make sure the pharmacy is open so you can pick up prescription now. If there is a problem, you may contact your provider through MyChart messaging and have the prescription routed to another  pharmacy.  Your safety is important to [place]. If you have drug allergies check your prescription carefully.   For the next 24 hours you can use MyChart to ask questions about today's visit, request a non-urgent call back, or ask for a work or school excuse. You will get an email in the next two  days asking about your experience. I hope that your e-visit has been valuable and will speed your recovery.

## 2023-03-16 NOTE — Progress Notes (Signed)
I have spent 5 minutes in review of e-visit questionnaire, review and updating patient chart, medical decision making and response to patient.  ° °Jerrell Mangel W Secilia Apps, NP ° °  °

## 2023-05-06 ENCOUNTER — Telehealth: Payer: 59 | Admitting: Physician Assistant

## 2023-05-06 DIAGNOSIS — J029 Acute pharyngitis, unspecified: Secondary | ICD-10-CM | POA: Diagnosis not present

## 2023-05-06 NOTE — Progress Notes (Signed)
E-Visit for Sore Throat  We are sorry that you are not feeling well.  Here is how we plan to help!  Your symptoms indicate a likely viral infection (Pharyngitis).   Pharyngitis is inflammation in the back of the throat which can cause a sore throat, scratchiness and sometimes difficulty swallowing.   Pharyngitis is typically caused by a respiratory virus and will just run its course.  Please keep in mind that your symptoms could last up to 10 days.  For throat pain, we recommend over the counter oral pain relief medications such as acetaminophen or aspirin, or anti-inflammatory medications such as ibuprofen or naproxen sodium.  Topical treatments such as oral throat lozenges or sprays may be used as needed.  Avoid close contact with loved ones, especially the very young and elderly.  Remember to wash your hands thoroughly throughout the day as this is the number one way to prevent the spread of infection and wipe down door knobs and counters with disinfectant.  After careful review of your answers, I would not recommend an antibiotic for your condition.  Antibiotics should not be used to treat conditions that we suspect are caused by viruses like the virus that causes the common cold or flu. However, some people can have Strep with atypical symptoms. You may need formal testing in clinic or office to confirm if your symptoms continue or worsen.  Providers prescribe antibiotics to treat infections caused by bacteria. Antibiotics are very powerful in treating bacterial infections when they are used properly.  To maintain their effectiveness, they should be used only when necessary.  Overuse of antibiotics has resulted in the development of super bugs that are resistant to treatment!    Home Care: Only take medications as instructed by your medical team. Do not drink alcohol while taking these medications. A steam or ultrasonic humidifier can help congestion.  You can place a towel over your head and  breathe in the steam from hot water coming from a faucet. Avoid close contacts especially the very young and the elderly. Cover your mouth when you cough or sneeze. Always remember to wash your hands.  Get Help Right Away If: You develop worsening fever or throat pain. You develop a severe head ache or visual changes. Your symptoms persist after you have completed your treatment plan.  Make sure you Understand these instructions. Will watch your condition. Will get help right away if you are not doing well or get worse.   Thank you for choosing an e-visit.  Your e-visit answers were reviewed by a board certified advanced clinical practitioner to complete your personal care plan. Depending upon the condition, your plan could have included both over the counter or prescription medications.  Please review your pharmacy choice. Make sure the pharmacy is open so you can pick up prescription now. If there is a problem, you may contact your provider through MyChart messaging and have the prescription routed to another pharmacy.  Your safety is important to us. If you have drug allergies check your prescription carefully.   For the next 24 hours you can use MyChart to ask questions about today's visit, request a non-urgent call back, or ask for a work or school excuse. You will get an email in the next two days asking about your experience. I hope that your e-visit has been valuable and will speed your recovery.  I have spent 5 minutes in review of e-visit questionnaire, review and updating patient chart, medical decision making and response   to patient.   Yanette Tripoli M Ronak Duquette, PA-C  

## 2023-05-20 ENCOUNTER — Telehealth: Payer: 59 | Admitting: Nurse Practitioner

## 2023-05-20 DIAGNOSIS — J069 Acute upper respiratory infection, unspecified: Secondary | ICD-10-CM

## 2023-05-20 MED ORDER — BENZONATATE 100 MG PO CAPS
100.0000 mg | ORAL_CAPSULE | Freq: Three times a day (TID) | ORAL | 0 refills | Status: DC | PRN
Start: 2023-05-20 — End: 2023-07-30

## 2023-05-20 MED ORDER — IPRATROPIUM BROMIDE 0.03 % NA SOLN: 2.0000 | Freq: Two times a day (BID) | NASAL | 12 refills | Status: DC

## 2023-05-20 NOTE — Progress Notes (Signed)
E-Visit for Upper Respiratory Infection   We are sorry you are not feeling well.  Here is how we plan to help!  Based on what you have shared with me, it looks like you may have a viral upper respiratory infection.  Upper respiratory infections are caused by a large number of viruses; however, rhinovirus is the most common cause.   Symptoms vary from person to person, with common symptoms including sore throat, cough, fatigue or lack of energy and feeling of general discomfort.  A low-grade fever of up to 100.4 may present, but is often uncommon.  Symptoms vary however, and are closely related to a person's age or underlying illnesses.  The most common symptoms associated with an upper respiratory infection are nasal discharge or congestion, cough, sneezing, headache and pressure in the ears and face.  These symptoms usually persist for about 3 to 10 days, but can last up to 2 weeks.  It is important to know that upper respiratory infections do not cause serious illness or complications in most cases.    Upper respiratory infections can be transmitted from person to person, with the most common method of transmission being a person's hands.  The virus is able to live on the skin and can infect other persons for up to 2 hours after direct contact.  Also, these can be transmitted when someone coughs or sneezes; thus, it is important to cover the mouth to reduce this risk.  To keep the spread of the illness at bay, good hand hygiene is very important.  This is an infection that is most likely caused by a virus. There are no specific treatments other than to help you with the symptoms until the infection runs its course.  We are sorry you are not feeling well.  Here is how we plan to help!   For nasal congestion, you may use an oral decongestants such as Mucinex D or if you have glaucoma or high blood pressure use plain Mucinex.  Saline nasal spray or nasal drops can help and can safely be used as often as  needed for congestion.  For your congestion, I have prescribed Ipratropium Bromide nasal spray 0.03% two sprays in each nostril 2-3 times a day  If you do not have a history of heart disease, hypertension, diabetes or thyroid disease, prostate/bladder issues or glaucoma, you may also use Sudafed to treat nasal congestion.  It is highly recommended that you consult with a pharmacist or your primary care physician to ensure this medication is safe for you to take.     If you have a cough, you may use cough suppressants such as Delsym and Robitussin.  If you have glaucoma or high blood pressure, you can also use Coricidin HBP.   For cough I have prescribed for you A prescription cough medication called Tessalon Perles 100 mg. You may take 1-2 capsules every 8 hours as needed for cough  If you have a sore or scratchy throat, use a saltwater gargle-  to  teaspoon of salt dissolved in a 4-ounce to 8-ounce glass of warm water.  Gargle the solution for approximately 15-30 seconds and then spit.  It is important not to swallow the solution.  You can also use throat lozenges/cough drops and Chloraseptic spray to help with throat pain or discomfort.  Warm or cold liquids can also be helpful in relieving throat pain.  For headache, pain or general discomfort, you can use Ibuprofen or Tylenol as directed.     Some authorities believe that zinc sprays or the use of Echinacea may shorten the course of your symptoms.   HOME CARE Only take medications as instructed by your medical team. Be sure to drink plenty of fluids. Water is fine as well as fruit juices, sodas and electrolyte beverages. You may want to stay away from caffeine or alcohol. If you are nauseated, try taking small sips of liquids. How do you know if you are getting enough fluid? Your urine should be a pale yellow or almost colorless. Get rest. Taking a steamy shower or using a humidifier may help nasal congestion and ease sore throat pain. You can  place a towel over your head and breathe in the steam from hot water coming from a faucet. Using a saline nasal spray works much the same way. Cough drops, hard candies and sore throat lozenges may ease your cough. Avoid close contacts especially the very young and the elderly Cover your mouth if you cough or sneeze Always remember to wash your hands.   GET HELP RIGHT AWAY IF: You develop worsening fever. If your symptoms do not improve within 10 days You develop yellow or green discharge from your nose over 3 days. You have coughing fits You develop a severe head ache or visual changes. You develop shortness of breath, difficulty breathing or start having chest pain Your symptoms persist after you have completed your treatment plan  MAKE SURE YOU  Understand these instructions. Will watch your condition. Will get help right away if you are not doing well or get worse.  Thank you for choosing an e-visit.  Your e-visit answers were reviewed by a board certified advanced clinical practitioner to complete your personal care plan. Depending upon the condition, your plan could have included both over the counter or prescription medications.  Please review your pharmacy choice. Make sure the pharmacy is open so you can pick up prescription now. If there is a problem, you may contact your provider through MyChart messaging and have the prescription routed to another pharmacy.  Your safety is important to us. If you have drug allergies check your prescription carefully.   For the next 24 hours you can use MyChart to ask questions about today's visit, request a non-urgent call back, or ask for a work or school excuse. You will get an email in the next two days asking about your experience. I hope that your e-visit has been valuable and will speed your recovery.  Meds ordered this encounter  Medications   benzonatate (TESSALON) 100 MG capsule    Sig: Take 1 capsule (100 mg total) by mouth 3  (three) times daily as needed.    Dispense:  30 capsule    Refill:  0   ipratropium (ATROVENT) 0.03 % nasal spray    Sig: Place 2 sprays into both nostrils every 12 (twelve) hours.    Dispense:  30 mL    Refill:  12    I spent approximately 5 minutes reviewing the patient's history, current symptoms and coordinating their care today.   

## 2023-05-21 ENCOUNTER — Telehealth: Payer: 59 | Admitting: Physician Assistant

## 2023-05-21 DIAGNOSIS — J029 Acute pharyngitis, unspecified: Secondary | ICD-10-CM

## 2023-05-21 DIAGNOSIS — Z20818 Contact with and (suspected) exposure to other bacterial communicable diseases: Secondary | ICD-10-CM

## 2023-05-21 MED ORDER — AZITHROMYCIN 250 MG PO TABS
ORAL_TABLET | ORAL | 0 refills | Status: AC
Start: 2023-05-21 — End: 2023-05-26

## 2023-05-21 NOTE — Progress Notes (Signed)
I have spent 5 minutes in review of e-visit questionnaire, review and updating patient chart, medical decision making and response to patient.   Mia Milan Cody Jacklynn Dehaas, PA-C    

## 2023-05-21 NOTE — Progress Notes (Signed)
Message sent to patient requesting further input regarding current symptoms. Awaiting patient response.  

## 2023-05-21 NOTE — Progress Notes (Signed)
E-Visit for Sore Throat - Strep Symptoms  We are sorry that you are not feeling well.  Here is how we plan to help!  Based on what you have shared with me it is likely that you have strep pharyngitis.  Strep pharyngitis is inflammation and infection in the back of the throat.  This is an infection cause by bacteria and is treated with antibiotics.  I have prescribed Azithromycin 250 mg two tablets today and then one daily for 4 additional days. For throat pain, we recommend over the counter oral pain relief medications such as acetaminophen or aspirin, or anti-inflammatory medications such as ibuprofen or naproxen sodium. Topical treatments such as oral throat lozenges or sprays may be used as needed. Strep infections are not as easily transmitted as other respiratory infections, however we still recommend that you avoid close contact with loved ones, especially the very young and elderly.  Remember to wash your hands thoroughly throughout the day as this is the number one way to prevent the spread of infection and wipe down door knobs and counters with disinfectant.   Home Care: Only take medications as instructed by your medical team. Complete the entire course of an antibiotic. Do not take these medications with alcohol. A steam or ultrasonic humidifier can help congestion.  You can place a towel over your head and breathe in the steam from hot water coming from a faucet. Avoid close contacts especially the very young and the elderly. Cover your mouth when you cough or sneeze. Always remember to wash your hands.  Get Help Right Away If: You develop worsening fever or sinus pain. You develop a severe head ache or visual changes. Your symptoms persist after you have completed your treatment plan.  Make sure you Understand these instructions. Will watch your condition. Will get help right away if you are not doing well or get worse.   Thank you for choosing an e-visit.  Your e-visit  answers were reviewed by a board certified advanced clinical practitioner to complete your personal care plan. Depending upon the condition, your plan could have included both over the counter or prescription medications.  Please review your pharmacy choice. Make sure the pharmacy is open so you can pick up prescription now. If there is a problem, you may contact your provider through [employer] and have the prescription routed to another pharmacy.  Your safety is important to [place]. If you have drug allergies check your prescription carefully.   For the next 24 hours you can use MyChart to ask questions about today's visit, request a non-urgent call back, or ask for a work or school excuse. You will get an email in the next two days asking about your experience. I hope that your e-visit has been valuable and will speed your recovery.

## 2023-05-28 ENCOUNTER — Telehealth: Payer: 59 | Admitting: Physician Assistant

## 2023-05-28 DIAGNOSIS — K047 Periapical abscess without sinus: Secondary | ICD-10-CM | POA: Diagnosis not present

## 2023-05-28 MED ORDER — NAPROXEN 500 MG PO TABS: 500.0000 mg | ORAL_TABLET | Freq: Two times a day (BID) | ORAL | 0 refills | Status: DC

## 2023-05-28 MED ORDER — CLINDAMYCIN HCL 300 MG PO CAPS: 300.0000 mg | ORAL_CAPSULE | Freq: Three times a day (TID) | ORAL | 0 refills | Status: AC

## 2023-05-28 NOTE — Progress Notes (Signed)
E-Visit for Dental Pain  We are sorry that you are not feeling well.  Here is how we plan to help!  Based on what you have shared with me in the questionnaire, it sounds like you have a possible dental infection.   Clindamycin 300mg  3 times a day for 7 days and Naprosyn 500mg  2 times a day for 7 days for discomfort  It is imperative that you see a dentist within 10 days of this eVisit to determine the cause of the dental pain and be sure it is adequately treated  A toothache or tooth pain is caused when the nerve in the root of a tooth or surrounding a tooth is irritated. Dental (tooth) infection, decay, injury, or loss of a tooth are the most common causes of dental pain. Pain may also occur after an extraction (tooth is pulled out). Pain sometimes originates from other areas and radiates to the jaw, thus appearing to be tooth pain.Bacteria growing inside your mouth can contribute to gum disease and dental decay, both of which can cause pain. A toothache occurs from inflammation of the central portion of the tooth called pulp. The pulp contains nerve endings that are very sensitive to pain. Inflammation to the pulp or pulpitis may be caused by dental cavities, trauma, and infection.    HOME CARE:   For toothaches: Over-the-counter pain medications such as acetaminophen or ibuprofen may be used. Take these as directed on the package while you arrange for a dental appointment. Avoid very cold or hot foods, because they may make the pain worse. You may get relief from biting on a cotton ball soaked in oil of cloves. You can get oil of cloves at most drug stores.  For jaw pain:  Aspirin may be helpful for problems in the joint of the jaw in adults. If pain happens every time you open your mouth widely, the temporomandibular joint (TMJ) may be the source of the pain. Yawning or taking a large bite of food may worsen the pain. An appointment with your doctor or dentist will help you find the  cause.     GET HELP RIGHT AWAY IF:  You have a high fever or chills If you have had a recent head or face injury and develop headache, light headedness, nausea, vomiting, or other symptoms that concern you after an injury to your face or mouth, you could have a more serious injury in addition to your dental injury. A facial rash associated with a toothache: This condition may improve with medication. Contact your doctor for them to decide what is appropriate. Any jaw pain occurring with chest pain: Although jaw pain is most commonly caused by dental disease, it is sometimes referred pain from other areas. People with heart disease, especially people who have had stents placed, people with diabetes, or those who have had heart surgery may have jaw pain as a symptom of heart attack or angina. If your jaw or tooth pain is associated with lightheadedness, sweating, or shortness of breath, you should see a doctor as soon as possible. Trouble swallowing or excessive pain or bleeding from gums: If you have a history of a weakened immune system, diabetes, or steroid use, you may be more susceptible to infections. Infections can often be more severe and extensive or caused by unusual organisms. Dental and gum infections in people with these conditions may require more aggressive treatment. An abscess may need draining or IV antibiotics, for example.  MAKE SURE YOU  Understand these instructions. Will watch your condition. Will get help right away if you are not doing well or get worse.  Thank you for choosing an e-visit.  Your e-visit answers were reviewed by a board certified advanced clinical practitioner to complete your personal care plan. Depending upon the condition, your plan could have included both over the counter or prescription medications.  Please review your pharmacy choice. Make sure the pharmacy is open so you can pick up prescription now. If there is a problem, you may contact your  provider through [employer] and have the prescription routed to another pharmacy.  Your safety is important to [place]. If you have drug allergies check your prescription carefully.   For the next 24 hours you can use MyChart to ask questions about today's visit, request a non-urgent call back, or ask for a work or school excuse. You will get an email in the next two days asking about your experience. I hope that your e-visit has been valuable and will speed your recovery.   I have spent 5 minutes in review of e-visit questionnaire, review and updating patient chart, medical decision making and response to patient.   Tylene Fantasia Ward, PA-C

## 2023-06-02 ENCOUNTER — Telehealth: Payer: 59 | Admitting: Family Medicine

## 2023-06-02 DIAGNOSIS — H60501 Unspecified acute noninfective otitis externa, right ear: Secondary | ICD-10-CM

## 2023-06-02 DIAGNOSIS — J019 Acute sinusitis, unspecified: Secondary | ICD-10-CM

## 2023-06-02 DIAGNOSIS — B9689 Other specified bacterial agents as the cause of diseases classified elsewhere: Secondary | ICD-10-CM

## 2023-06-02 MED ORDER — CEFDINIR 300 MG PO CAPS: 300.0000 mg | ORAL_CAPSULE | Freq: Two times a day (BID) | ORAL | 0 refills | Status: AC

## 2023-06-02 MED ORDER — CIPROFLOXACIN-DEXAMETHASONE 0.3-0.1 % OT SUSP: 4.0000 [drp] | Freq: Two times a day (BID) | OTIC | 0 refills | Status: AC

## 2023-06-02 NOTE — Patient Instructions (Signed)
Otitis Externa  Otitis externa is an infection of the outer ear canal. The outer ear canal is the area between the outside of the ear and the eardrum. Otitis externa is sometimes called swimmer's ear. What are the causes? Common causes of this condition include: Swimming in dirty water. Moisture in the ear. An injury to the inside of the ear. An object stuck in the ear. A cut or scrape on the outside of the ear or in the ear canal. What increases the risk? You are more likely to develop this condition if you go swimming often. What are the signs or symptoms? The first symptom of this condition is often itching in the ear. Later symptoms of the condition include: Swelling of the ear. Redness in the ear. Ear pain. The pain may get worse when you pull on your ear. Pus coming from the ear. How is this diagnosed? This condition may be diagnosed by examining the ear and testing fluid from the ear for bacteria and funguses. How is this treated? This condition may be treated with: Antibiotic ear drops. These are often given for 10-14 days. Medicines to reduce itching and swelling. Follow these instructions at home: If you were prescribed antibiotic ear drops, use them as told by your health care provider. Do not stop using the antibiotic even if you start to feel better. Take over-the-counter and prescription medicines only as told by your health care provider. Avoid getting water in your ears as told by your health care provider. This may include avoiding swimming or water sports for a few days. Keep all follow-up visits. This is important. How is this prevented? Keep your ears dry. Use the corner of a towel to dry your ears after you swim or bathe. Avoid scratching or putting things in your ear. Doing these things can damage the ear canal or remove the protective wax that lines it, which makes it easier for bacteria and funguses to grow. Avoid swimming in lakes, polluted water, or swimming  pools that may not have enough chlorine. Contact a health care provider if: You have a fever. Your ear is still red, swollen, painful, or draining pus after 3 days. Your redness, swelling, or pain gets worse. You have a severe headache. Get help right away if: You have redness, swelling, and pain or tenderness in the area behind your ear. Summary Otitis externa is an infection of the outer ear canal. Common causes include swimming in dirty water, moisture in the ear, or a cut or scrape in the ear. Symptoms include pain, redness, and swelling of the ear canal. If you were prescribed antibiotic ear drops, use them as told by your health care provider. Do not stop using the antibiotic even if you start to feel better. This information is not intended to replace advice given to you by your health care provider. Make sure you discuss any questions you have with your health care provider. Document Revised: 08/10/2020 Document Reviewed: 08/10/2020 Elsevier Patient Education  2024 [employer]  

## 2023-06-02 NOTE — Progress Notes (Signed)
Virtual Visit Consent   Sherry Wright, you are scheduled for a virtual visit with a [facility] provider today. Just as with appointments in the office, your consent must be obtained to participate. Your consent will be active for this visit and any virtual visit you may have with one of our providers in the next 365 days. If you have a MyChart account, a copy of this consent can be sent to you electronically.  As this is a virtual visit, video technology does not allow for your provider to perform a traditional examination. This may limit your provider's ability to fully assess your condition. If your provider identifies any concerns that need to be evaluated in person or the need to arrange testing (such as labs, EKG, etc.), we will make arrangements to do so. Although advances in technology are sophisticated, we cannot ensure that it will always work on either your end or our end. If the connection with a video visit is poor, the visit may have to be switched to a telephone visit. With either a video or telephone visit, we are not always able to ensure that we have a secure connection.  By engaging in this virtual visit, you consent to the provision of healthcare and authorize for your insurance to be billed (if applicable) for the services provided during this visit. Depending on your insurance coverage, you may receive a charge related to this service.  I need to obtain your verbal consent now. Are you willing to proceed with your visit today? Sherry Wright has provided verbal consent on 06/02/2023 for a virtual visit (video or telephone). Georgana Curio, FNP  Date: 06/02/2023 4:25 PM  Virtual Visit via Video Note   I, Georgana Curio, connected with  Sherry Wright  ([id number], April 01, 1966) on 06/02/23 at  4:30 PM EST by a video-enabled telemedicine application and verified that I am speaking with the correct person using two identifiers.  Location: Patient: Virtual Visit Location Patient:  Home Provider: Virtual Visit Location Provider: Home Office   I discussed the limitations of evaluation and management by telemedicine and the availability of in person appointments. The patient expressed understanding and agreed to proceed.    History of Present Illness: Sherry Wright is a 57 y.o. who identifies as a female who was assigned female at birth, and is being seen today for rt ear pain, worse when pulls on it, no fever, rt max sinus pain, sore throat, pain in throat and aling gums but has no teeth. Sx for a week worsening. Marland Kitchen  HPI: HPI  Problems:  Patient Active Problem List   Diagnosis Date Noted   Encounter for screening colonoscopy 02/20/2023   Adenomatous polyp of colon 02/20/2023   Encounter for long-term (current) use of high-risk medication 10/17/2017   Seropositive rheumatoid arthritis (HCC) 10/17/2017   Tobacco use 10/17/2017   High blood pressure 10/14/2002    Allergies:  Allergies  Allergen Reactions   Penicillins Hives and Other (See Comments)    Has patient had a PCN reaction causing immediate rash, facial/tongue/throat swelling, SOB or lightheadedness with hypotension: yes Has patient had a PCN reaction causing severe rash involving mucus membranes or skin necrosis: no Has patient had a PCN reaction that required hospitalization no Has patient had a PCN reaction occurring within the last 10 years:no If all of the above answers are "NO", then may proceed with Cephalosporin use.    Folic Acid Other (See Comments)    Upset Stomach  Prednisone Palpitations and Other (See Comments)    Patient said that prednisone caused her heart to flutter when she was on this medication.   Medications:  Current Outpatient Medications:    azelastine (ASTELIN) 0.1 % nasal spray, Place 1 spray into both nostrils 2 (two) times daily. Use in each nostril as directed, Disp: 30 mL, Rfl: 0   benzonatate (TESSALON) 100 MG capsule, Take 1 capsule (100 mg total) by mouth 3 (three)  times daily as needed., Disp: 30 capsule, Rfl: 0   clindamycin (CLEOCIN) 300 MG capsule, Take 1 capsule (300 mg total) by mouth 3 (three) times daily for 7 days., Disp: 21 capsule, Rfl: 0   famotidine (PEPCID) 20 MG tablet, Take 20 mg by mouth 2 (two) times daily., Disp: , Rfl:    fluticasone (FLONASE) 50 MCG/ACT nasal spray, Place 2 sprays into both nostrils daily., Disp: 16 g, Rfl: 6   ibuprofen (ADVIL,MOTRIN) 800 MG tablet, Take 800 mg by mouth every 8 (eight) hours as needed for headache or moderate pain. , Disp: , Rfl:    ipratropium (ATROVENT) 0.03 % nasal spray, Place 2 sprays into both nostrils every 12 (twelve) hours., Disp: 30 mL, Rfl: 12   lisinopril-hydrochlorothiazide (PRINZIDE,ZESTORETIC) 20-12.5 MG tablet, Take 2 tablets by mouth daily. , Disp: , Rfl:    naproxen (NAPROSYN) 500 MG tablet, Take 1 tablet (500 mg total) by mouth 2 (two) times daily with a meal., Disp: 30 tablet, Rfl: 0   omeprazole (PRILOSEC) 40 MG capsule, Take 1 capsule (40 mg total) by mouth daily., Disp: 14 capsule, Rfl: 0  Observations/Objective: Patient is well-developed, well-nourished in no acute distress.  Resting comfortably  at home.  Head is normocephalic, atraumatic.  No labored breathing.  Speech is clear and coherent with logical content.  Patient is alert and oriented at baseline.  Assessment and Plan: 1. Acute otitis externa of right ear, unspecified type (Primary)  2. Acute bacterial sinusitis  Heat to ear, ibuprofen as directed, UC if sx persist or worsen.   Follow Up Instructions: I discussed the assessment and treatment plan with the patient. The patient was provided an opportunity to ask questions and all were answered. The patient agreed with the plan and demonstrated an understanding of the instructions.  A copy of instructions were sent to the patient via MyChart unless otherwise noted below.     The patient was advised to call back or seek an in-person evaluation if the symptoms  worsen or if the condition fails to improve as anticipated.    Georgana Curio, FNP

## 2023-06-25 ENCOUNTER — Telehealth: Payer: 59 | Admitting: Physician Assistant

## 2023-06-25 DIAGNOSIS — J019 Acute sinusitis, unspecified: Secondary | ICD-10-CM

## 2023-06-25 DIAGNOSIS — B9789 Other viral agents as the cause of diseases classified elsewhere: Secondary | ICD-10-CM

## 2023-06-25 NOTE — Progress Notes (Signed)
 E-Visit for Sinus Problems  We are sorry that you are not feeling well.  Here is how we plan to help!  Based on what you have shared with me it looks like you have sinusitis.  Sinusitis is inflammation and infection in the sinus cavities of the head.  Based on your presentation I believe you most likely have Acute Viral Sinusitis.This is an infection most likely caused by a virus. There is not specific treatment for viral sinusitis other than to help you with the symptoms until the infection runs its course.  You may use an oral decongestant such as Mucinex D or if you have glaucoma or high blood pressure use plain Mucinex. Saline nasal spray help and can safely be used as often as needed for congestion, I recommend Flonase.   Some authorities believe that zinc sprays or the use of Echinacea may shorten the course of your symptoms.  Sinus infections are not as easily transmitted as other respiratory infection, however we still recommend that you avoid close contact with loved ones, especially the very young and elderly.  Remember to wash your hands thoroughly throughout the day as this is the number one way to prevent the spread of infection!  Home Care: Only take medications as instructed by your medical team. Do not take these medications with alcohol. A steam or ultrasonic humidifier can help congestion.  You can place a towel over your head and breathe in the steam from hot water coming from a faucet. Avoid close contacts especially the very young and the elderly. Cover your mouth when you cough or sneeze. Always remember to wash your hands.  Get Help Right Away If: You develop worsening fever or sinus pain. You develop a severe head ache or visual changes. Your symptoms persist after you have completed your treatment plan.  Make sure you Understand these instructions. Will watch your condition. Will get help right away if you are not doing well or get worse.   Thank you for choosing  an e-visit.  Your e-visit answers were reviewed by a board certified advanced clinical practitioner to complete your personal care plan. Depending upon the condition, your plan could have included both over the counter or prescription medications.  Please review your pharmacy choice. Make sure the pharmacy is open so you can pick up prescription now. If there is a problem, you may contact your provider through [employer] and have the prescription routed to another pharmacy.  Your safety is important to [place]. If you have drug allergies check your prescription carefully.   For the next 24 hours you can use MyChart to ask questions about today's visit, request a non-urgent call back, or ask for a work or school excuse. You will get an email in the next two days asking about your experience. I hope that your e-visit has been valuable and will speed your recovery.  I have spent 5 minutes in review of e-visit questionnaire, review and updating patient chart, medical decision making and response to patient.   Tylene Fantasia Ward, PA-C

## 2023-07-04 ENCOUNTER — Telehealth: Payer: 59 | Admitting: Physician Assistant

## 2023-07-04 DIAGNOSIS — R6889 Other general symptoms and signs: Secondary | ICD-10-CM | POA: Diagnosis not present

## 2023-07-04 MED ORDER — OSELTAMIVIR PHOSPHATE 75 MG PO CAPS: 75.0000 mg | ORAL_CAPSULE | Freq: Two times a day (BID) | ORAL | 0 refills | Status: DC

## 2023-07-04 NOTE — Progress Notes (Signed)

## 2023-07-04 NOTE — Progress Notes (Signed)
I have spent 5 minutes in review of e-visit questionnaire, review and updating patient chart, medical decision making and response to patient.   Mia Milan Cody Jacklynn Dehaas, PA-C    

## 2023-07-06 ENCOUNTER — Telehealth: Payer: 59 | Admitting: Nurse Practitioner

## 2023-07-06 DIAGNOSIS — R6889 Other general symptoms and signs: Secondary | ICD-10-CM

## 2023-07-06 NOTE — Progress Notes (Signed)
I have spent 5 minutes in review of e-visit questionnaire, review and updating patient chart, medical decision making and response to patient.   Claiborne Rigg, NP

## 2023-07-06 NOTE — Progress Notes (Signed)
E visit for Flu like symptoms   We are sorry that you are not feeling well.  Here is how we plan to help!  Swollen lymph nodes and sore throat are side effects of viral illnesses. You can take tylenol and alternate with motrin for sore throat pain.   Influenza or "the flu" is   an infection caused by a respiratory virus. The flu virus is highly contagious and persons who did not receive their yearly flu vaccination may "catch" the flu from close contact.  We have anti-viral medications to treat the viruses that cause this infection. They are not a "cure" and only shorten the course of the infection. These prescriptions are most effective when they are given within the first 2 days of "flu" symptoms. Antiviral medication are indicated if you have a high risk of complications from the flu. You should  also consider an antiviral medication if you are in close contact with someone who is at risk. These medications can help patients avoid complications from the flu  but have side effects that you should know. Possible side effects from Tamiflu or oseltamivir include nausea, vomiting, diarrhea, dizziness, headaches, eye redness, sleep problems or other respiratory symptoms. You should not take Tamiflu if you have an allergy to oseltamivir or any to the ingredients in Tamiflu.  Based upon your symptoms and potential risk factors I recommend that you follow the flu symptoms recommendation that I have listed below.  ANYONE WHO HAS FLU SYMPTOMS SHOULD: Stay home. The flu is highly contagious and going out or to work exposes others! Be sure to drink plenty of fluids. Water is fine as well as fruit juices, sodas and electrolyte beverages. You may want to stay away from caffeine or alcohol. If you are nauseated, try taking small sips of liquids. How do you know if you are getting enough fluid? Your urine should be a pale yellow or almost colorless. Get rest. Taking a steamy shower or using a humidifier may help  nasal congestion and ease sore throat pain. Using a saline nasal spray works much the same way. Cough drops, hard candies and sore throat lozenges may ease your cough. Line up a caregiver. Have someone check on you regularly.   GET HELP RIGHT AWAY IF: You cannot keep down liquids or your medications. You become short of breath Your fell like you are going to pass out or loose consciousness. Your symptoms persist after you have completed your treatment plan MAKE SURE YOU  Understand these instructions. Will watch your condition. Will get help right away if you are not doing well or get worse.  Your e-visit answers were reviewed by a board certified advanced clinical practitioner to complete your personal care plan.  Depending on the condition, your plan could have included both over the counter or prescription medications.  If there is a problem please reply  once you have received a response from your provider.  Your safety is important to [place].  If you have drug allergies check your prescription carefully.    You can use MyChart to ask questions about today's visit, request a non-urgent call back, or ask for a work or school excuse for 24 hours related to this e-Visit. If it has been greater than 24 hours you will need to follow up with your provider, or enter a new e-Visit to address those concerns.  You will get an e-mail in the next two days asking about your experience.  I hope that your e-visit  has been valuable and will speed your recovery. Thank you for using e-visits.

## 2023-07-22 ENCOUNTER — Telehealth: Payer: 59 | Admitting: Physician Assistant

## 2023-07-22 DIAGNOSIS — M6740 Ganglion, unspecified site: Secondary | ICD-10-CM | POA: Diagnosis not present

## 2023-07-22 NOTE — Progress Notes (Signed)
 Ms. Sherry, Wright are scheduled for a virtual visit with your provider today.    Just as we do with appointments in the office, we must obtain your consent to participate.  Your consent will be active for this visit and any virtual visit you may have with one of our providers in the next 365 days.    If you have a MyChart account, I can also send a copy of this consent to you electronically.  All virtual visits are billed to your insurance company just like a traditional visit in the office.  As this is a virtual visit, video technology does not allow for your provider to perform a traditional examination.  This may limit your provider's ability to fully assess your condition.  If your provider identifies any concerns that need to be evaluated in person or the need to arrange testing such as labs, EKG, etc, we will make arrangements to do so.    Although advances in technology are sophisticated, we cannot ensure that it will always work on either your end or our end.  If the connection with a video visit is poor, we may have to switch to a telephone visit.  With either a video or telephone visit, we are not always able to ensure that we have a secure connection.   I need to obtain your verbal consent now.   Are you willing to proceed with your visit today?   Sherry Wright has provided verbal consent on 07/22/2023 for a virtual visit (video or telephone).   Sherry Wright, [place] 07/22/2023  3:29 PM   Date:  07/22/2023   ID:  Sherry Wright, DOB [birth year], MRN [id number]  Patient Location: Home Provider Location: Home Office   Participants: Patient and Provider for Visit and Wrap up  Method of visit: Video  Location of Patient: Home Location of Provider: Home Office Consent was obtain for visit over the video. Services rendered by provider: Visit was performed via video  A video enabled telemedicine application was used and I verified that I am speaking with the correct person using two  identifiers.  [facility]   Chief Complaint:  elbow pain  History of Present Illness:    Sherry Wright is a 58 y.o. female with history as stated below. Presents video telehealth for an acute care visit  Pt reports she has had a bump to the forearm for 1 month. For the last 2 days the pain has worsened and she feels that the pain is radiating up to the arm. Denies numbness/tingling to the arm. The area is no erythematous or warm.   Past Medical, Surgical, Social History, Allergies, and Medications have been Reviewed.  Past Medical History:  Diagnosis Date   Arthritis    GERD (gastroesophageal reflux disease)    Hypertension     No outpatient medications have been marked as taking for the 07/22/23 encounter (Appointment) with [facility] PROVIDER.     Allergies:   Penicillins, Folic acid, and Prednisone   ROS See HPI for history of present illness.  Physical Exam Constitutional:      Appearance: Normal appearance.  Musculoskeletal:        General: Normal range of motion.     Comments: 1cm raised, well circumscribed skin colored lesion noted just distal to the olecranon. No erythema noted. Appears most consistent with ganglion cyst.                 MDM: Pt  with swelling and pain just distal to the elbow. Exam limited with nature of video visit however it appears most consistent with a ganglion cyst. No erythema noted and no warmth reported by patient. Sxs are subacute, doubt infectious process. Have recommended nsaids, cold compresses, follow up with pcp or ortho.   Tests Ordered: No orders of the defined types were placed in this encounter.   Medication Changes: No orders of the defined types were placed in this encounter.    Disposition:  Follow up  Signed, Sherry Kales, PA-C  07/22/2023 3:29 PM

## 2023-07-22 NOTE — Patient Instructions (Signed)
  Orlan Bis, thank you for joining [name], PA-C for today's virtual visit.  While this provider is not your primary care provider (PCP), if your PCP is located in our provider database this encounter information will be shared with them immediately following your visit.   A [facility] MyChart account gives you access to today's visit and all your visits, tests, and labs performed at [facility] " click here if you don't have a [facility] MyChart account or go to [email or web address]  Consent: (Patient) Orlan Bis provided verbal consent for this virtual visit at the beginning of the encounter.  Current Medications:  Current Outpatient Medications:    azelastine  (ASTELIN ) 0.1 % nasal spray, Place 1 spray into both nostrils 2 (two) times daily. Use in each nostril as directed, Disp: 30 mL, Rfl: 0   benzonatate  (TESSALON ) 100 MG capsule, Take 1 capsule (100 mg total) by mouth 3 (three) times daily as needed., Disp: 30 capsule, Rfl: 0   famotidine (PEPCID) 20 MG tablet, Take 20 mg by mouth 2 (two) times daily., Disp: , Rfl:    ibuprofen (ADVIL,MOTRIN) 800 MG tablet, Take 800 mg by mouth every 8 (eight) hours as needed for headache or moderate pain. , Disp: , Rfl:    ipratropium (ATROVENT ) 0.03 % nasal spray, Place 2 sprays into both nostrils every 12 (twelve) hours., Disp: 30 mL, Rfl: 12   lisinopril-hydrochlorothiazide  (PRINZIDE,ZESTORETIC) 20-12.5 MG tablet, Take 2 tablets by mouth daily. , Disp: , Rfl:    naproxen  (NAPROSYN ) 500 MG tablet, Take 1 tablet (500 mg total) by mouth 2 (two) times daily with a meal., Disp: 30 tablet, Rfl: 0   omeprazole  (PRILOSEC) 40 MG capsule, Take 1 capsule (40 mg total) by mouth daily., Disp: 14 capsule, Rfl: 0   oseltamivir  (TAMIFLU ) 75 MG capsule, Take 1 capsule (75 mg total) by mouth 2 (two) times daily., Disp: 10 capsule, Rfl: 0   Medications ordered in this encounter:  No orders of the defined types were placed in this  encounter.    *If you need refills on other medications prior to your next appointment, please contact your pharmacy*  Follow-Up: Call back or seek an in-person evaluation if the symptoms worsen or if the condition fails to improve as anticipated.  [facility] [phone]  Other Instructions Continue taking 600mg  ibuprofen every 6 hours.   Please make a follow up appointment with your regular doctor in 1 week or with an orthopedic doctor in 1 week for reassessment.    If you have been instructed to have an in-person evaluation today at a local Urgent Care facility, please use the link below. It will take you to a list of all of our available [facility], including address, phone number and hours of operation. Please do not delay care.  [facility]  If you or a family member do not have a primary care provider, use the link below to schedule a visit and establish care. When you choose a [facility] primary care physician or advanced practice provider, you gain a long-term partner in health. Find a Primary Care Provider  Learn more about [facility] in-office and virtual care options: [facility] - Get Care Now

## 2023-07-25 ENCOUNTER — Telehealth: Payer: 59 | Admitting: Family Medicine

## 2023-07-25 DIAGNOSIS — J069 Acute upper respiratory infection, unspecified: Secondary | ICD-10-CM

## 2023-07-25 NOTE — Progress Notes (Signed)
  Because you have had several viral illnesses in the last several months and still continue to symptoms we feel your condition warrants further evaluation and recommend that you be seen in a face-to-face visit in your PCP office or at your local urgent care.    NOTE: There will be NO CHARGE for this E-Visit   If you are having a true medical emergency, please call 911.

## 2023-07-29 ENCOUNTER — Telehealth: Payer: 59

## 2023-07-29 DIAGNOSIS — J069 Acute upper respiratory infection, unspecified: Secondary | ICD-10-CM

## 2023-07-30 MED ORDER — BENZONATATE 100 MG PO CAPS: 100.0000 mg | ORAL_CAPSULE | Freq: Three times a day (TID) | ORAL | 0 refills | Status: DC | PRN

## 2023-07-30 NOTE — Progress Notes (Signed)

## 2023-07-30 NOTE — Progress Notes (Signed)
 I have spent 5 minutes in review of e-visit questionnaire, review and updating patient chart, medical decision making and response to patient.   Piedad Climes, PA-C

## 2023-08-22 ENCOUNTER — Telehealth: Admitting: Physician Assistant

## 2023-08-22 DIAGNOSIS — R6889 Other general symptoms and signs: Secondary | ICD-10-CM | POA: Diagnosis not present

## 2023-08-22 DIAGNOSIS — Z20828 Contact with and (suspected) exposure to other viral communicable diseases: Secondary | ICD-10-CM | POA: Diagnosis not present

## 2023-08-22 MED ORDER — OSELTAMIVIR PHOSPHATE 75 MG PO CAPS
75.0000 mg | ORAL_CAPSULE | Freq: Two times a day (BID) | ORAL | 0 refills | Status: AC
Start: 2023-08-22 — End: 2023-08-27

## 2023-08-22 NOTE — Progress Notes (Signed)

## 2023-08-22 NOTE — Progress Notes (Signed)
 I have spent 5 minutes in review of e-visit questionnaire, review and updating patient chart, medical decision making and response to patient.   Piedad Climes, PA-C

## 2023-08-22 NOTE — Progress Notes (Signed)
 Message sent to patient requesting further input regarding current symptoms. Awaiting patient response.

## 2023-08-25 ENCOUNTER — Telehealth: Admitting: Physician Assistant

## 2023-08-25 DIAGNOSIS — R6889 Other general symptoms and signs: Secondary | ICD-10-CM

## 2023-08-25 DIAGNOSIS — J029 Acute pharyngitis, unspecified: Secondary | ICD-10-CM

## 2023-08-25 NOTE — Progress Notes (Signed)
  Because you've been seen for this once via telehealth, I feel your condition warrants further evaluation and I recommend that you be seen in a face-to-face visit.   NOTE: There will be NO CHARGE for this E-Visit   If you are having a true medical emergency, please call 911.     For an urgent face to face visit, [facility] has multiple urgent care centers for your convenience.  Click the link below for the full list of locations and hours, walk-in wait times, appointment scheduling options and driving directions:  Urgent Care - [place], [place], [place], [place], [place], [place]  [place]     Your MyChart E-visit questionnaire answers were reviewed by a board certified advanced clinical practitioner to complete your personal care plan based on your specific symptoms.    Thank you for using e-Visits.

## 2023-09-07 ENCOUNTER — Encounter: Payer: Self-pay | Admitting: Physician Assistant

## 2023-09-07 ENCOUNTER — Telehealth: Admitting: Physician Assistant

## 2023-09-07 DIAGNOSIS — K649 Unspecified hemorrhoids: Secondary | ICD-10-CM | POA: Diagnosis not present

## 2023-09-07 MED ORDER — HYDROCORTISONE 2.5 % EX CREA: TOPICAL_CREAM | Freq: Two times a day (BID) | CUTANEOUS | 0 refills | Status: AC

## 2023-09-07 NOTE — Progress Notes (Signed)
 E-Visit for Hemorrhoid  We are sorry that you are not feeling well. We are here to help!  Hemorrhoids are swollen veins in the rectum. They can cause itching, bleeding, and pain. Hemorrhoids are very common.  In some cases, you can see or feel hemorrhoids around the outside of the rectum. In other cases, you cannot see them because they are hidden inside the rectum. Be patient - It can take months for this to improve or go away.   Hemorrhoids do not always cause symptoms. But when they do, symptoms can include: ?Itching of the skin around the anus ?Bleeding - Bleeding is usually painless. You might see bright red blood after using the toilet. ?Pain - If a blood clot forms inside a hemorrhoid, this can cause pain. It can also cause a lump that you might be able to feel.   What can I do to keep from getting more hemorrhoids? -- The most important thing you can do is to keep from getting constipated. You should have a bowel movement at least a few times a week. When you have a bowel movement, you also should not have to push too much. Plus, your bowel movements should not be too hard. Being constipated and having hard bowel movements can make hemorrhoids worse.   I have prescribed Topical Hydrocortisone ointment 2.5%.  Apply to area two times per day for 30 days  HOME CARE: Sitz Baths twice daily. Soak buttocks in 2 or 3 inches of warm water for 10 to 15 minutes. Do not add soap, bubble bath, or anything to the water. Stool softener such as Colace 100 mg twice daily AND Miralax 1 scoop daily until you have regular soft stools Over the counter Preparation H Tucks Pads Witch Hazel  Here are some steps you can take to avoid getting constipated or having hard stools:  ?Eat lots of fruits, vegetables, and other foods with fiber. Fiber helps to increase bowel movements. If you do not get enough fiber from your diet, you can take fiber supplements. These come in the form of powders, wafers, or  pills. Some examples are Metamucil, Citrucel, Benefiber and FiberCon. If you take a fiber supplement, be sure to read the label so you know how much to take. If you're not sure, ask your provider or nurse. ?Take medicines called "stool softeners" such as docusate sodium (sample brand names: Colace, Dulcolax). These medicines increase the number of bowel movements you have. They are safe to take and they can prevent problems later.  You should request a referral for a follow up evaluation with a Gastroenterologist (GI doctor) to evaluate this chronic and relapsing condition - even if it improves to see what further steps need to be taken. This is highly linked to chronic constipation and straining to have a bowel movement. It may require further treatment or surgical intervention.   GET HELP RIGHT AWAY IF: You develop severe pain You have heavy bleeding   FOLLOW UP WITH YOUR PRIMARY PROVIDER IF: If your symptoms do not improve within 10 days  MAKE SURE YOU  Understand these instructions. Will watch your condition. Will get help right away if you are not doing well or get worse.   Thank you for choosing an e-visit.  Your e-visit answers were reviewed by a board certified advanced clinical practitioner to complete your personal care plan. Depending upon the condition, your plan could have included both over the counter or prescription medications.  Please review your pharmacy choice. Make  sure the pharmacy is open so you can pick up prescription now. If there is a problem, you may contact your provider through [employer] and have the prescription routed to another pharmacy.  Your safety is important to [place]. If you have drug allergies check your prescription carefully.   For the next 24 hours you can use MyChart to ask questions about today's visit, request a non-urgent call back, or ask for a work or school excuse. You will get an email in the next two days asking about your experience.  I hope that your e-visit has been valuable and will speed your recovery.  I have spent 5 minutes in review of e-visit questionnaire, review and updating patient chart, medical decision making and response to patient.   Kasandra Knudsen Mayers, PA-C

## 2023-09-17 ENCOUNTER — Telehealth: Admitting: Physician Assistant

## 2023-09-17 DIAGNOSIS — T300 Burn of unspecified body region, unspecified degree: Secondary | ICD-10-CM | POA: Diagnosis not present

## 2023-09-18 MED ORDER — MUPIROCIN 2 % EX OINT
1.0000 | TOPICAL_OINTMENT | Freq: Two times a day (BID) | CUTANEOUS | 0 refills | Status: AC
Start: 2023-09-18 — End: ?
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
## 2023-11-14 ENCOUNTER — Telehealth: Admitting: Physician Assistant

## 2023-11-14 DIAGNOSIS — J069 Acute upper respiratory infection, unspecified: Secondary | ICD-10-CM | POA: Diagnosis not present

## 2023-11-14 MED ORDER — BENZONATATE 100 MG PO CAPS: 100.0000 mg | ORAL_CAPSULE | Freq: Three times a day (TID) | ORAL | 0 refills | Status: DC | PRN

## 2023-11-14 NOTE — Progress Notes (Signed)
 I have spent 5 minutes in review of e-visit questionnaire, review and updating patient chart, medical decision making and response to patient.   Laure Kidney, PA-C

## 2023-11-14 NOTE — Progress Notes (Signed)
 E-Visit for Cough   We are sorry that you are not feeling well.  Here is how we plan to help!  Based on your presentation I believe you most likely have A cough due to a virus.  This is called viral bronchitis and is best treated by rest, plenty of fluids and control of the cough.  You may use Ibuprofen or Tylenol as directed to help your symptoms.     In addition you may use A prescription cough medication called Tessalon Perles 100mg . You may take 1-2 capsules every 8 hours as needed for your cough.   From your responses in the eVisit questionnaire you describe inflammation in the upper respiratory tract which is causing a significant cough.  This is commonly called Bronchitis and has four common causes:   Allergies Viral Infections Acid Reflux Bacterial Infection Allergies, viruses and acid reflux are treated by controlling symptoms or eliminating the cause. An example might be a cough caused by taking certain blood pressure medications. You stop the cough by changing the medication. Another example might be a cough caused by acid reflux. Controlling the reflux helps control the cough.  USE OF BRONCHODILATOR ("RESCUE") INHALERS: There is a risk from using your bronchodilator too frequently.  The risk is that over-reliance on a medication which only relaxes the muscles surrounding the breathing tubes can reduce the effectiveness of medications prescribed to reduce swelling and congestion of the tubes themselves.  Although you feel brief relief from the bronchodilator inhaler, your asthma may actually be worsening with the tubes becoming more swollen and filled with mucus.  This can delay other crucial treatments, such as oral steroid medications. If you need to use a bronchodilator inhaler daily, several times per day, you should discuss this with your provider.  There are probably better treatments that could be used to keep your asthma under control.     HOME CARE Only take medications as  instructed by your medical team. Complete the entire course of an antibiotic. Drink plenty of fluids and get plenty of rest. Avoid close contacts especially the very young and the elderly Cover your mouth if you cough or cough into your sleeve. Always remember to wash your hands A steam or ultrasonic humidifier can help congestion.   GET HELP RIGHT AWAY IF: You develop worsening fever. You become short of breath You cough up blood. Your symptoms persist after you have completed your treatment plan MAKE SURE YOU  Understand these instructions. Will watch your condition. Will get help right away if you are not doing well or get worse.    Thank you for choosing an e-visit.  Your e-visit answers were reviewed by a board certified advanced clinical practitioner to complete your personal care plan. Depending upon the condition, your plan could have included both over the counter or prescription medications.  Please review your pharmacy choice. Make sure the pharmacy is open so you can pick up prescription now. If there is a problem, you may contact your provider through [employer] and have the prescription routed to another pharmacy.  Your safety is important to [place]. If you have drug allergies check your prescription carefully.   For the next 24 hours you can use MyChart to ask questions about today's visit, request a non-urgent call back, or ask for a work or school excuse. You will get an email in the next two days asking about your experience. I hope that your e-visit has been valuable and will speed your recovery.

## 2023-11-19 ENCOUNTER — Telehealth: Admitting: Physician Assistant

## 2023-11-19 DIAGNOSIS — B9689 Other specified bacterial agents as the cause of diseases classified elsewhere: Secondary | ICD-10-CM

## 2023-11-19 MED ORDER — AZITHROMYCIN 250 MG PO TABS
ORAL_TABLET | ORAL | 0 refills | Status: AC
Start: 2023-11-19 — End: 2023-11-24

## 2023-11-19 MED ORDER — PROMETHAZINE-DM 6.25-15 MG/5ML PO SYRP
5.0000 mL | ORAL_SOLUTION | Freq: Four times a day (QID) | ORAL | 0 refills | Status: AC | PRN
Start: 2023-11-19 — End: ?

## 2023-11-19 MED ORDER — BENZONATATE 100 MG PO CAPS
100.0000 mg | ORAL_CAPSULE | Freq: Three times a day (TID) | ORAL | 0 refills | Status: DC | PRN
Start: 2023-11-19 — End: 2023-11-19

## 2023-11-19 NOTE — Progress Notes (Signed)
 I have spent 5 minutes in review of e-visit questionnaire, review and updating patient chart, medical decision making and response to patient.   Piedad Climes, PA-C

## 2023-11-19 NOTE — Progress Notes (Signed)
 E-Visit for Cough   We are sorry that you are not feeling well.  Here is how we plan to help!  Based on your presentation I believe you most likely have A cough due to bacteria.  When patients have a fever and a productive cough with a change in color or increased sputum production, we are concerned about bacterial bronchitis.  If left untreated it can progress to pneumonia.  If your symptoms do not improve with your treatment plan it is important that you contact your provider.   I have prescribed Azithromyin 250 mg: two tablets now and then one tablet daily for 4 additonal days    In addition you may use A prescription cough medication called Tessalon  Perles 100mg . You may take 1-2 capsules every 8 hours as needed for your cough. I have sent in a refill of this for you.   From your responses in the eVisit questionnaire you describe inflammation in the upper respiratory tract which is causing a significant cough.  This is commonly called Bronchitis and has four common causes:   Allergies Viral Infections Acid Reflux Bacterial Infection Allergies, viruses and acid reflux are treated by controlling symptoms or eliminating the cause. An example might be a cough caused by taking certain blood pressure medications. You stop the cough by changing the medication. Another example might be a cough caused by acid reflux. Controlling the reflux helps control the cough.  USE OF BRONCHODILATOR ("RESCUE") INHALERS: There is a risk from using your bronchodilator too frequently.  The risk is that over-reliance on a medication which only relaxes the muscles surrounding the breathing tubes can reduce the effectiveness of medications prescribed to reduce swelling and congestion of the tubes themselves.  Although you feel brief relief from the bronchodilator inhaler, your asthma may actually be worsening with the tubes becoming more swollen and filled with mucus.  This can delay other crucial treatments, such as oral  steroid medications. If you need to use a bronchodilator inhaler daily, several times per day, you should discuss this with your provider.  There are probably better treatments that could be used to keep your asthma under control.     HOME CARE Only take medications as instructed by your medical team. Complete the entire course of an antibiotic. Drink plenty of fluids and get plenty of rest. Avoid close contacts especially the very young and the elderly Cover your mouth if you cough or cough into your sleeve. Always remember to wash your hands A steam or ultrasonic humidifier can help congestion.   GET HELP RIGHT AWAY IF: You develop worsening fever. You become short of breath You cough up blood. Your symptoms persist after you have completed your treatment plan MAKE SURE YOU  Understand these instructions. Will watch your condition. Will get help right away if you are not doing well or get worse.    Thank you for choosing an e-visit.  Your e-visit answers were reviewed by a board certified advanced clinical practitioner to complete your personal care plan. Depending upon the condition, your plan could have included both over the counter or prescription medications.  Please review your pharmacy choice. Make sure the pharmacy is open so you can pick up prescription now. If there is a problem, you may contact your provider through [employer] and have the prescription routed to another pharmacy.  Your safety is important to us . If you have drug allergies check your prescription carefully.   For the next 24 hours you can  use MyChart to ask questions about today's visit, request a non-urgent call back, or ask for a work or school excuse. You will get an email in the next two days asking about your experience. I hope that your e-visit has been valuable and will speed your recovery.

## 2023-12-02 ENCOUNTER — Telehealth

## 2023-12-02 DIAGNOSIS — H9202 Otalgia, left ear: Secondary | ICD-10-CM

## 2023-12-02 DIAGNOSIS — J029 Acute pharyngitis, unspecified: Secondary | ICD-10-CM

## 2023-12-03 ENCOUNTER — Telehealth: Admitting: Physician Assistant

## 2023-12-03 DIAGNOSIS — R590 Localized enlarged lymph nodes: Secondary | ICD-10-CM | POA: Diagnosis not present

## 2023-12-03 DIAGNOSIS — H9202 Otalgia, left ear: Secondary | ICD-10-CM | POA: Diagnosis not present

## 2023-12-03 MED ORDER — CEFDINIR 300 MG PO CAPS
300.0000 mg | ORAL_CAPSULE | Freq: Two times a day (BID) | ORAL | 0 refills | Status: DC
Start: 2023-12-03 — End: 2024-01-24

## 2023-12-03 NOTE — Progress Notes (Signed)
 Virtual Visit Consent   Sherry Wright, you are scheduled for a virtual visit with a [facility] provider today. Just as with appointments in the office, your consent must be obtained to participate. Your consent will be active for this visit and any virtual visit you may have with one of our providers in the next 365 days. If you have a MyChart account, a copy of this consent can be sent to you electronically.  As this is a virtual visit, video technology does not allow for your provider to perform a traditional examination. This may limit your provider's ability to fully assess your condition. If your provider identifies any concerns that need to be evaluated in person or the need to arrange testing (such as labs, EKG, etc.), we will make arrangements to do so. Although advances in technology are sophisticated, we cannot ensure that it will always work on either your end or our end. If the connection with a video visit is poor, the visit may have to be switched to a telephone visit. With either a video or telephone visit, we are not always able to ensure that we have a secure connection.  By engaging in this virtual visit, you consent to the provision of healthcare and authorize for your insurance to be billed (if applicable) for the services provided during this visit. Depending on your insurance coverage, you may receive a charge related to this service.  I need to obtain your verbal consent now. Are you willing to proceed with your visit today? Sherry Wright has provided verbal consent on 12/03/2023 for a virtual visit (video or telephone). Sherry Wright, [place]  Date: 12/03/2023 8:46 AM   Virtual Visit via Video Note   I, Sherry Wright, connected with  Sherry Wright  ([id number], 1966/02/18) on 12/03/23 at  8:30 AM EDT by a video-enabled telemedicine application and verified that I am speaking with the correct person using two identifiers.  Location: Patient: Virtual Visit Location  Patient: Home Provider: Virtual Visit Location Provider: Home Office   I discussed the limitations of evaluation and management by telemedicine and the availability of in person appointments. The patient expressed understanding and agreed to proceed.    History of Present Illness: Sherry Wright is a 58 y.o. who identifies as a female who was assigned female at birth, and is being seen today for L otalgia over the past few days associated with L-sided sore throat and neck tenderness. Now with swollen lymph node of the neck. Denies fever, chills. Difficulty swallowing.   HPI: HPI  Problems:  Patient Active Problem List   Diagnosis Date Noted   Encounter for screening colonoscopy 02/20/2023   Adenomatous polyp of colon 02/20/2023   Encounter for long-term (current) use of high-risk medication 10/17/2017   Seropositive rheumatoid arthritis (HCC) 10/17/2017   Tobacco use 10/17/2017   High blood pressure 10/14/2002    Allergies:  Allergies  Allergen Reactions   Penicillins Hives and Other (See Comments)    Has patient had a PCN reaction causing immediate rash, facial/tongue/throat swelling, SOB or lightheadedness with hypotension: yes Has patient had a PCN reaction causing severe rash involving mucus membranes or skin necrosis: no Has patient had a PCN reaction that required hospitalization no Has patient had a PCN reaction occurring within the last 10 years:no If all of the above answers are NO, then may proceed with Cephalosporin use.    Folic Acid Other (See Comments)    Upset Stomach  Prednisone Palpitations and Other (See Comments)    Patient said that prednisone caused her heart to flutter when she was on this medication.   Medications:  Current Outpatient Medications:    cefdinir  (OMNICEF ) 300 MG capsule, Take 1 capsule (300 mg total) by mouth 2 (two) times daily., Disp: 20 capsule, Rfl: 0   azelastine  (ASTELIN ) 0.1 % nasal spray, Place 1 spray into both nostrils 2 (two) times  daily. Use in each nostril as directed, Disp: 30 mL, Rfl: 0   famotidine (PEPCID) 20 MG tablet, Take 20 mg by mouth 2 (two) times daily., Disp: , Rfl:    ibuprofen (ADVIL,MOTRIN) 800 MG tablet, Take 800 mg by mouth every 8 (eight) hours as needed for headache or moderate pain. , Disp: , Rfl:    ipratropium (ATROVENT ) 0.03 % nasal spray, Place 2 sprays into both nostrils every 12 (twelve) hours., Disp: 30 mL, Rfl: 12   lisinopril-hydrochlorothiazide  (PRINZIDE,ZESTORETIC) 20-12.5 MG tablet, Take 2 tablets by mouth daily. , Disp: , Rfl:    mupirocin  ointment (BACTROBAN ) 2 %, Apply 1 Application topically 2 (two) times daily., Disp: 22 g, Rfl: 0   omeprazole  (PRILOSEC) 40 MG capsule, Take 1 capsule (40 mg total) by mouth daily., Disp: 14 capsule, Rfl: 0   promethazine -dextromethorphan (PROMETHAZINE -DM) 6.25-15 MG/5ML syrup, Take 5 mLs by mouth 4 (four) times daily as needed for cough., Disp: 118 mL, Rfl: 0  Observations/Objective: Patient is well-developed, well-nourished in no acute distress.  Resting comfortably at home.  Head is normocephalic, atraumatic.  No labored breathing. Speech is clear and coherent with logical content.  Patient is alert and oriented at baseline.  Visible erythematous and swollen L preauricular lymph node without streaking Mild oropharyngeal erythema without edema or exudate. Uvula midline.  Assessment and Plan: 1. Acute otalgia, left (Primary) - cefdinir  (OMNICEF ) 300 MG capsule; Take 1 capsule (300 mg total) by mouth 2 (two) times daily.  Dispense: 20 capsule; Refill: 0  2. Anterior auricular lymphadenopathy - cefdinir  (OMNICEF ) 300 MG capsule; Take 1 capsule (300 mg total) by mouth 2 (two) times daily.  Dispense: 20 capsule; Refill: 0  Giving otalgia and L auricular adenopathy, concern for AOM or other bacterial etiology of symptoms. No symptoms of AOE. Throat with mild erythema but no exudate or sign of peritonsillar abscess. Start Cefdinir  BID x 10 days. OTC  medications reviewed. In-person evaluation for any non-resolving, new or worsening symptoms despite Tx.   Follow Up Instructions: I discussed the assessment and treatment plan with the patient. The patient was provided an opportunity to ask questions and all were answered. The patient agreed with the plan and demonstrated an understanding of the instructions.  A copy of instructions were sent to the patient via MyChart unless otherwise noted below.    The patient was advised to call back or seek an in-person evaluation if the symptoms worsen or if the condition fails to improve as anticipated.    Sherry Velma Lunger, PA-C

## 2023-12-03 NOTE — Progress Notes (Signed)
   Thank you for the details you included in the comment boxes. Those details are very helpful in determining the best course of treatment for you and help us  to provide the best care.Because we need to take a look at the throat giving the unilateral throat pain and sensation of swelling, we recommend that you schedule a Virtual Urgent Care video visit in order for the provider to better assess what is going on.  The provider will be able to give you a more accurate diagnosis and treatment plan if we can more freely discuss your symptoms and with the addition of a virtual examination.   If you change your visit to a video visit, we will bill your insurance (similar to an office visit) and you will not be charged for this e-Visit. You will be able to stay at home and speak with the first available [facility] advanced practice provider. The link to do a video visit is in the drop down Menu tab of your Welcome screen in MyChart.

## 2023-12-03 NOTE — Patient Instructions (Signed)
  Nathanel KATHEE Rase, thank you for joining [name], PA-C for today's virtual visit.  While this provider is not your primary care provider (PCP), if your PCP is located in our provider database this encounter information will be shared with them immediately following your visit.   A [facility] MyChart account gives you access to today's visit and all your visits, tests, and labs performed at [facility]  click here if you don't have a [facility] MyChart account or go to [email or web address]  Consent: (Patient) Nathanel KATHEE Rase provided verbal consent for this virtual visit at the beginning of the encounter.  Current Medications:  Current Outpatient Medications:    cefdinir  (OMNICEF ) 300 MG capsule, Take 1 capsule (300 mg total) by mouth 2 (two) times daily., Disp: 20 capsule, Rfl: 0   azelastine  (ASTELIN ) 0.1 % nasal spray, Place 1 spray into both nostrils 2 (two) times daily. Use in each nostril as directed, Disp: 30 mL, Rfl: 0   famotidine (PEPCID) 20 MG tablet, Take 20 mg by mouth 2 (two) times daily., Disp: , Rfl:    ibuprofen (ADVIL,MOTRIN) 800 MG tablet, Take 800 mg by mouth every 8 (eight) hours as needed for headache or moderate pain. , Disp: , Rfl:    ipratropium (ATROVENT ) 0.03 % nasal spray, Place 2 sprays into both nostrils every 12 (twelve) hours., Disp: 30 mL, Rfl: 12   lisinopril-hydrochlorothiazide  (PRINZIDE,ZESTORETIC) 20-12.5 MG tablet, Take 2 tablets by mouth daily. , Disp: , Rfl:    mupirocin  ointment (BACTROBAN ) 2 %, Apply 1 Application topically 2 (two) times daily., Disp: 22 g, Rfl: 0   omeprazole  (PRILOSEC) 40 MG capsule, Take 1 capsule (40 mg total) by mouth daily., Disp: 14 capsule, Rfl: 0   promethazine -dextromethorphan (PROMETHAZINE -DM) 6.25-15 MG/5ML syrup, Take 5 mLs by mouth 4 (four) times daily as needed for cough., Disp: 118 mL, Rfl: 0   Medications ordered in this encounter:  Meds ordered this encounter  Medications   cefdinir  (OMNICEF )  300 MG capsule    Sig: Take 1 capsule (300 mg total) by mouth 2 (two) times daily.    Dispense:  20 capsule    Refill:  0    Supervising Provider:   BLAISE ALEENE KIDD [[id number]]     *If you need refills on other medications prior to your next appointment, please contact your pharmacy*  Follow-Up: Call back or seek an in-person evaluation if the symptoms worsen or if the condition fails to improve as anticipated.  [facility] Virtual [facility] [phone]  Other Instructions Please take antibiotic as directed. Tylenol  or Ibuprofen for pain. If you note any non-resolving, new, or worsening symptoms despite treatment, please seek an in-person evaluation ASAP.    If you have been instructed to have an in-person evaluation today at a local Urgent Care facility, please use the link below. It will take you to a list of all of our available [facility], including address, phone number and hours of operation. Please do not delay care.  [facility]  If you or a family member do not have a primary care provider, use the link below to schedule a visit and establish care. When you choose a [facility] primary care physician or advanced practice provider, you gain a long-term partner in health. Find a Primary Care Provider  Learn more about [facility]'s in-office and virtual care options:  - Get Care Now

## 2024-01-24 ENCOUNTER — Telehealth: Admitting: Physician Assistant

## 2024-01-24 DIAGNOSIS — B9689 Other specified bacterial agents as the cause of diseases classified elsewhere: Secondary | ICD-10-CM | POA: Diagnosis not present

## 2024-01-24 DIAGNOSIS — J028 Acute pharyngitis due to other specified organisms: Secondary | ICD-10-CM | POA: Diagnosis not present

## 2024-01-24 MED ORDER — CEFDINIR 300 MG PO CAPS: 300.0000 mg | ORAL_CAPSULE | Freq: Two times a day (BID) | ORAL | 0 refills | Status: DC

## 2024-01-24 NOTE — Patient Instructions (Signed)
 Sherry Wright, thank you for joining [name], PA-C for today's virtual visit.  While this provider is not your primary care provider (PCP), if your PCP is located in our provider database this encounter information will be shared with them immediately following your visit.   A [facility] MyChart account gives you access to today's visit and all your visits, tests, and labs performed at [facility]  click here if you don't have a [facility] MyChart account or go to [email or web address]  Consent: (Patient) Sherry Wright provided verbal consent for this virtual visit at the beginning of the encounter.  Current Medications:  Current Outpatient Medications:    cefdinir (OMNICEF) 300 MG capsule, Take 1 capsule (300 mg total) by mouth 2 (two) times daily., Disp: 20 capsule, Rfl: 0   azelastine (ASTELIN) 0.1 % nasal spray, Place 1 spray into both nostrils 2 (two) times daily. Use in each nostril as directed, Disp: 30 mL, Rfl: 0   famotidine (PEPCID) 20 MG tablet, Take 20 mg by mouth 2 (two) times daily., Disp: , Rfl:    ibuprofen (ADVIL,MOTRIN) 800 MG tablet, Take 800 mg by mouth every 8 (eight) hours as needed for headache or moderate pain. , Disp: , Rfl:    ipratropium (ATROVENT) 0.03 % nasal spray, Place 2 sprays into both nostrils every 12 (twelve) hours., Disp: 30 mL, Rfl: 12   lisinopril-hydrochlorothiazide (PRINZIDE,ZESTORETIC) 20-12.5 MG tablet, Take 2 tablets by mouth daily. , Disp: , Rfl:    mupirocin ointment (BACTROBAN) 2 %, Apply 1 Application topically 2 (two) times daily., Disp: 22 g, Rfl: 0   omeprazole (PRILOSEC) 40 MG capsule, Take 1 capsule (40 mg total) by mouth daily., Disp: 14 capsule, Rfl: 0   promethazine-dextromethorphan (PROMETHAZINE-DM) 6.25-15 MG/5ML syrup, Take 5 mLs by mouth 4 (four) times daily as needed for cough., Disp: 118 mL, Rfl: 0   Medications ordered in this encounter:  Meds ordered this encounter  Medications   cefdinir (OMNICEF)  300 MG capsule    Sig: Take 1 capsule (300 mg total) by mouth 2 (two) times daily.    Dispense:  20 capsule    Refill:  0    Supervising Provider:   BLAISE ALEENE KIDD [[id number]]     *If you need refills on other medications prior to your next appointment, please contact your pharmacy*  Follow-Up: Call back or seek an in-person evaluation if the symptoms worsen or if the condition fails to improve as anticipated.   Virtual Care [phone]  Other Instructions Pharyngitis  Pharyngitis is inflammation of the throat (pharynx). It is a very common cause of sore throat. Pharyngitis can be caused by a bacteria, but it is usually caused by a virus. Most cases of pharyngitis get better on their own without treatment. What are the causes? This condition may be caused by: Infection by viruses (viral). Viral pharyngitis spreads easily from person to person (is contagious) through coughing, sneezing, and sharing of personal items or utensils such as cups, forks, spoons, and toothbrushes. Infection by bacteria (bacterial). Bacterial pharyngitis may be spread by touching the nose or face after coming in contact with the bacteria, or through close contact, such as kissing. Allergies. Allergies can cause buildup of mucus in the throat (post-nasal drip), leading to inflammation and irritation. Allergies can also cause blocked nasal passages, forcing breathing through the mouth, which dries and irritates the throat. What increases the risk? You are more likely to develop this condition if: You are  75-12 years old. You are exposed to crowded environments such as daycare, school, or dormitory living. You live in a cold climate. You have a weakened disease-fighting (immune) system. What are the signs or symptoms? Symptoms of this condition vary by the cause. Common symptoms of this condition include: Sore throat. Fatigue. Low-grade fever. Stuffy nose (nasal congestion) and  cough. Headache. Other symptoms may include: Glands in the neck (lymph nodes) that are swollen. Skin rashes. Plaque-like film on the throat or tonsils. This is often a symptom of bacterial pharyngitis. Vomiting. Red, itchy eyes (conjunctivitis). Loss of appetite. Joint pain and muscle aches. Enlarged tonsils. How is this diagnosed? This condition may be diagnosed based on your medical history and a physical exam. Your health care provider will ask you questions about your illness and your symptoms. A swab of your throat may be done to check for bacteria (rapid strep test). Other lab tests may also be done, depending on the suspected cause, but these are rare. How is this treated? Many times, treatment is not needed for this condition. Pharyngitis usually gets better in 3-4 days without treatment. Bacterial pharyngitis may be treated with antibiotic medicines. Follow these instructions at home: Medicines Take over-the-counter and prescription medicines only as told by your health care provider. If you were prescribed an antibiotic medicine, take it as told by your health care provider. Do not stop taking the antibiotic even if you start to feel better. Use throat sprays to soothe your throat as told by your health care provider. Children can get pharyngitis. Do not give your child aspirin because of the association with Reye's syndrome. Managing pain To help with pain, try: Sipping warm liquids, such as broth, herbal tea, or warm water. Eating or drinking cold or frozen liquids, such as frozen ice pops. Gargling with a mixture of salt and water 3-4 times a day or as needed. To make salt water, completely dissolve -1 tsp (3-6 g) of salt in 1 cup (237 mL) of warm water. Sucking on hard candy or throat lozenges. Putting a cool-mist humidifier in your bedroom at night to moisten the air. Sitting in the bathroom with the door closed for 5-10 minutes while you run hot water in the  shower.  General instructions  Do not use any products that contain nicotine or tobacco. These products include cigarettes, chewing tobacco, and vaping devices, such as e-cigarettes. If you need help quitting, ask your health care provider. Rest as told by your health care provider. Drink enough fluid to keep your urine pale yellow. How is this prevented? To help prevent becoming infected or spreading infection: Wash your hands often with soap and water for at least 20 seconds. If soap and water are not available, use hand sanitizer. Do not touch your eyes, nose, or mouth with unwashed hands, and wash hands after touching these areas. Do not share cups or eating utensils. Avoid close contact with people who are sick. Contact a health care provider if: You have large, tender lumps in your neck. You have a rash. You cough up green, yellow-brown, or bloody mucus. Get help right away if: Your neck becomes stiff. You drool or are unable to swallow liquids. You cannot drink or take medicines without vomiting. You have severe pain that does not go away, even after you take medicine. You have trouble breathing, and it is not caused by a stuffy nose. You have new pain and swelling in your joints such as the knees, ankles, wrists, or elbows.  These symptoms may represent a serious problem that is an emergency. Do not wait to see if the symptoms will go away. Get medical help right away. Call your local emergency services (911 in the [place] Do not drive yourself to the hospital. Summary Pharyngitis is redness, pain, and swelling (inflammation) of the throat (pharynx). While pharyngitis can be caused by a bacteria, the most common causes are viral. Most cases of pharyngitis get better on their own without treatment. Bacterial pharyngitis is treated with antibiotic medicines. This information is not intended to replace advice given to you by your health care provider. Make sure you discuss any  questions you have with your health care provider. Document Revised: 08/24/2020 Document Reviewed: 08/24/2020 Elsevier Patient Education  2024 [employer]   If you have been instructed to have an in-person evaluation today at a local Urgent Care facility, please use the link below. It will take you to a list of all of our available [facility], including address, phone number and hours of operation. Please do not delay care.  [facility]  If you or a family member do not have a primary care provider, use the link below to schedule a visit and establish care. When you choose a [facility] primary care physician or advanced practice provider, you gain a long-term partner in health. Find a Primary Care Provider  Learn more about [facility]'s in-office and virtual care options: [facility] - Get Care Now

## 2024-01-24 NOTE — Progress Notes (Signed)
 Virtual Visit Consent   Sherry Wright, you are scheduled for a virtual visit with a [facility] provider today. Just as with appointments in the office, your consent must be obtained to participate. Your consent will be active for this visit and any virtual visit you may have with one of our providers in the next 365 days. If you have a MyChart account, a copy of this consent can be sent to you electronically.  As this is a virtual visit, video technology does not allow for your provider to perform a traditional examination. This may limit your provider's ability to fully assess your condition. If your provider identifies any concerns that need to be evaluated in person or the need to arrange testing (such as labs, EKG, etc.), we will make arrangements to do so. Although advances in technology are sophisticated, we cannot ensure that it will always work on either your end or our end. If the connection with a video visit is poor, the visit may have to be switched to a telephone visit. With either a video or telephone visit, we are not always able to ensure that we have a secure connection.  By engaging in this virtual visit, you consent to the provision of healthcare and authorize for your insurance to be billed (if applicable) for the services provided during this visit. Depending on your insurance coverage, you may receive a charge related to this service.  I need to obtain your verbal consent now. Are you willing to proceed with your visit today? Sherry Wright has provided verbal consent on 01/24/2024 for a virtual visit (video or telephone). Sherry Sherry Dickinson, PA-C  Date: 01/24/2024 8:39 AM   Virtual Visit via Video Note   I, Sherry Wright, connected with  Sherry Wright  ([id number], Oct 26, 1965) on 01/24/24 at  8:30 AM EDT by a video-enabled telemedicine application and verified that I am speaking with the correct person using two identifiers.  Location: Patient: Virtual Visit Location  Patient: Home Provider: Virtual Visit Location Provider: Home Office   I discussed the limitations of evaluation and management by telemedicine and the availability of in person appointments. The patient expressed understanding and agreed to proceed.    History of Present Illness: Sherry Wright is a 58 y.o. who identifies as a female who was assigned female at birth, and is being seen today for sore throat.  HPI: Sore Throat  This is a new problem. The current episode started yesterday. The problem has been unchanged. There has been no fever (does report hot flashes). Associated symptoms include congestion, ear pain (left), headaches, a hoarse voice and swollen glands. Pertinent negatives include no coughing, diarrhea, ear discharge, plugged ear sensation, trouble swallowing or vomiting. Exposure to: had been kid's birthday last weekend. Treatments tried: tylenol  sinus. The treatment provided mild relief.     Problems:  Patient Active Problem List   Diagnosis Date Noted   Encounter for screening colonoscopy 02/20/2023   Adenomatous polyp of colon 02/20/2023   Encounter for long-term (current) use of high-risk medication 10/17/2017   Seropositive rheumatoid arthritis (HCC) 10/17/2017   Tobacco use 10/17/2017   High blood pressure 10/14/2002    Allergies:  Allergies  Allergen Reactions   Penicillins Hives and Other (See Comments)    Has patient had a PCN reaction causing immediate rash, facial/tongue/throat swelling, SOB or lightheadedness with hypotension: yes Has patient had a PCN reaction causing severe rash involving mucus membranes or skin necrosis: no Has patient had  a PCN reaction that required hospitalization no Has patient had a PCN reaction occurring within the last 10 years:no If all of the above answers are NO, then may proceed with Cephalosporin use.    Folic Acid Other (See Comments)    Upset Stomach   Prednisone Palpitations and Other (See Comments)    Patient said  that prednisone caused her heart to flutter when she was on this medication.   Medications:  Current Outpatient Medications:    cefdinir (OMNICEF) 300 MG capsule, Take 1 capsule (300 mg total) by mouth 2 (two) times daily., Disp: 20 capsule, Rfl: 0   azelastine (ASTELIN) 0.1 % nasal spray, Place 1 spray into both nostrils 2 (two) times daily. Use in each nostril as directed, Disp: 30 mL, Rfl: 0   famotidine (PEPCID) 20 MG tablet, Take 20 mg by mouth 2 (two) times daily., Disp: , Rfl:    ibuprofen (ADVIL,MOTRIN) 800 MG tablet, Take 800 mg by mouth every 8 (eight) hours as needed for headache or moderate pain. , Disp: , Rfl:    ipratropium (ATROVENT) 0.03 % nasal spray, Place 2 sprays into both nostrils every 12 (twelve) hours., Disp: 30 mL, Rfl: 12   lisinopril-hydrochlorothiazide (PRINZIDE,ZESTORETIC) 20-12.5 MG tablet, Take 2 tablets by mouth daily. , Disp: , Rfl:    mupirocin ointment (BACTROBAN) 2 %, Apply 1 Application topically 2 (two) times daily., Disp: 22 g, Rfl: 0   omeprazole (PRILOSEC) 40 MG capsule, Take 1 capsule (40 mg total) by mouth daily., Disp: 14 capsule, Rfl: 0   promethazine-dextromethorphan (PROMETHAZINE-DM) 6.25-15 MG/5ML syrup, Take 5 mLs by mouth 4 (four) times daily as needed for cough., Disp: 118 mL, Rfl: 0  Observations/Objective: Patient is well-developed, well-nourished in no acute distress.  Resting comfortably at home.  Head is normocephalic, atraumatic.  No labored breathing.  Speech is clear and coherent with logical content.  Patient is alert and oriented at baseline.    Assessment and Plan: 1. Bacterial pharyngitis (Primary) - cefdinir (OMNICEF) 300 MG capsule; Take 1 capsule (300 mg total) by mouth 2 (two) times daily.  Dispense: 20 capsule; Refill: 0  - Suspect strep throat - Cefdinir prescribed - Tylenol and Ibuprofen alternating every 4 hours - Salt water gargles - Chloraseptic spray - Liquid and soft food diet - Push fluids - New toothbrush  in 3 days - Seek in person evaluation if not improving or if symptoms worsen   Follow Up Instructions: I discussed the assessment and treatment plan with the patient. The patient was provided an opportunity to ask questions and all were answered. The patient agreed with the plan and demonstrated an understanding of the instructions.  A copy of instructions were sent to the patient via MyChart unless otherwise noted below.    The patient was advised to call back or seek an in-person evaluation if the symptoms worsen or if the condition fails to improve as anticipated.    Sherry Sherry Dickinson, PA-C

## 2024-02-05 ENCOUNTER — Encounter: Payer: Self-pay | Admitting: Physician Assistant

## 2024-02-05 ENCOUNTER — Telehealth: Admitting: Nurse Practitioner

## 2024-02-05 DIAGNOSIS — B029 Zoster without complications: Secondary | ICD-10-CM | POA: Diagnosis not present

## 2024-02-05 MED ORDER — VALACYCLOVIR HCL 1 G PO TABS
1000.0000 mg | ORAL_TABLET | Freq: Three times a day (TID) | ORAL | 0 refills | Status: AC
Start: 2024-02-05 — End: 2024-02-12

## 2024-02-05 MED ORDER — GABAPENTIN 300 MG PO CAPS
300.0000 mg | ORAL_CAPSULE | Freq: Two times a day (BID) | ORAL | 0 refills | Status: AC | PRN
Start: 2024-02-05 — End: ?

## 2024-02-05 NOTE — Progress Notes (Signed)
 E-visit for Shingles   We are sorry that you are not feeling well. Here is how we plan to help!  Based on what you shared with me it looks like you have shingles.  Shingles or herpes zoster, is a common infection of the nerves.  It is a painful rash caused by the herpes zoster virus.  This is the same virus that causes chickenpox.  After a person has chickenpox, the virus remains inactive in the nerve cells.  Years later, the virus can become active again and travel to the skin.  It typically will appear on one side of the face or body.  Burning or shooting pain, tingling, or itching are early signs of the infection.  Blisters typically scab over in 7 to 10 days and clear up within 2-4 weeks. Shingles is only contagious to people that have never had the chickenpox, the chickenpox vaccine, or anyone who has a compromised immune system.  You should avoid contact with these type of people until your blisters scab over.  I have prescribed Valacyclovir  1g three times daily for 7 days and also Gabapentin  300mg  twice daily as needed for pain   HOME CARE: Apply ice packs (wrapped in a thin towel), cool compresses, or soak in cool bath to help reduce pain. Use calamine lotion to calm itchy skin. Avoid scratching the rash. Avoid direct sunlight.  GET HELP RIGHT AWAY IF: Symptoms that don't away after treatment. A rash or blisters near your eye. Increased drainage, fever, or rash after treatment. Severe pain that doesn't go away.   MAKE SURE YOU   Understand these instructions. Will watch your condition. Will get help right away if you are not doing well or get worse.  Thank you for choosing an e-visit.  Your e-visit answers were reviewed by a board certified advanced clinical practitioner to complete your personal care plan. Depending upon the condition, your plan could have included both over the counter or prescription medications.  Please review your pharmacy choice. Make sure the pharmacy  is open so you can pick up prescription now. If there is a problem, you may contact your provider through [employer] and have the prescription routed to another pharmacy.  Your safety is important to us . If you have drug allergies check your prescription carefully.   For the next 24 hours you can use MyChart to ask questions about today's visit, request a non-urgent call back, or ask for a work or school excuse. You will get an email in the next two days asking about your experience. I hope that your e-visit has been valuable and will speed your recovery.   I spent approximately 5 minutes reviewing the patient's history, current symptoms and coordinating their care today.

## 2024-03-12 ENCOUNTER — Telehealth: Admitting: Physician Assistant

## 2024-03-12 DIAGNOSIS — R6884 Jaw pain: Secondary | ICD-10-CM

## 2024-03-12 NOTE — Progress Notes (Signed)
  Because of recurrent infections and recent antibiotic use, I feel your condition warrants further evaluation and I recommend that you be seen in a face-to-face visit.   NOTE: There will be NO CHARGE for this E-Visit   If you are having a true medical emergency, please call 911.     For an urgent face to face visit, [facility] has multiple urgent care centers for your convenience.  Click the link below for the full list of locations and hours, walk-in wait times, appointment scheduling options and driving directions:  Urgent Care - [place], [place], [place], [place], [place], [place]  [place]     Your MyChart E-visit questionnaire answers were reviewed by a board certified advanced clinical practitioner to complete your personal care plan based on your specific symptoms.    Thank you for using e-Visits.

## 2024-05-09 ENCOUNTER — Telehealth: Admitting: Family Medicine

## 2024-05-09 DIAGNOSIS — J069 Acute upper respiratory infection, unspecified: Secondary | ICD-10-CM

## 2024-05-09 MED ORDER — BENZONATATE 100 MG PO CAPS: 100.0000 mg | ORAL_CAPSULE | Freq: Two times a day (BID) | ORAL | 0 refills | Status: AC | PRN

## 2024-05-09 NOTE — Progress Notes (Signed)
 We are sorry that you are not feeling well.  Here is how we plan to help!  Based on your presentation I believe you most likely have A cough due to a virus.  This is called viral bronchitis and is best treated by rest, plenty of fluids and control of the cough.  You may use Ibuprofen or Tylenol  as directed to help your symptoms.     In addition you may use A prescription cough medication called Tessalon  Perles 100mg . You may take 1-2 capsules every 8 hours as needed for your cough.   From your responses in the eVisit questionnaire you describe inflammation in the upper respiratory tract which is causing a significant cough.  This is commonly called Bronchitis and has four common causes:   Allergies Viral Infections Acid Reflux Bacterial Infection Allergies, viruses and acid reflux are treated by controlling symptoms or eliminating the cause. An example might be a cough caused by taking certain blood pressure medications. You stop the cough by changing the medication. Another example might be a cough caused by acid reflux. Controlling the reflux helps control the cough.  USE OF BRONCHODILATOR (RESCUE) INHALERS: There is a risk from using your bronchodilator too frequently.  The risk is that over-reliance on a medication which only relaxes the muscles surrounding the breathing tubes can reduce the effectiveness of medications prescribed to reduce swelling and congestion of the tubes themselves.  Although you feel brief relief from the bronchodilator inhaler, your asthma may actually be worsening with the tubes becoming more swollen and filled with mucus.  This can delay other crucial treatments, such as oral steroid medications. If you need to use a bronchodilator inhaler daily, several times per day, you should discuss this with your provider.  There are probably better treatments that could be used to keep your asthma under control.     HOME CARE Only take medications as instructed by your  medical team. Complete the entire course of an antibiotic. Drink plenty of fluids and get plenty of rest. Avoid close contacts especially the very young and the elderly Cover your mouth if you cough or cough into your sleeve. Always remember to wash your hands A steam or ultrasonic humidifier can help congestion.   GET HELP RIGHT AWAY IF: You develop worsening fever. You become short of breath You cough up blood. Your symptoms persist after you have completed your treatment plan MAKE SURE YOU  Understand these instructions. Will watch your condition. Will get help right away if you are not doing well or get worse.  Your e-visit answers were reviewed by a board certified advanced clinical practitioner to complete your personal care plan.  Depending on the condition, your plan could have included both over the counter or prescription medications. If there is a problem please reply  once you have received a response from your provider. Your safety is important to us .  If you have drug allergies check your prescription carefully.    You can use MyChart to ask questions about today's visit, request a non-urgent call back, or ask for a work or school excuse for 24 hours related to this e-Visit. If it has been greater than 24 hours you will need to follow up with your provider, or enter a new e-Visit to address those concerns. You will get an e-mail in the next two days asking about your experience.  I hope that your e-visit has been valuable and will speed your recovery. Thank you for using e-visits.  I have spent 5 minutes in review of e-visit questionnaire, review and updating patient chart, medical decision making and response to patient.   Taelar Gronewold, FNP

## 2024-06-11 ENCOUNTER — Telehealth: Admitting: Physician Assistant

## 2024-06-11 DIAGNOSIS — J019 Acute sinusitis, unspecified: Secondary | ICD-10-CM

## 2024-06-11 DIAGNOSIS — B9789 Other viral agents as the cause of diseases classified elsewhere: Secondary | ICD-10-CM

## 2024-06-11 MED ORDER — FLUTICASONE PROPIONATE 50 MCG/ACT NA SUSP: 2.0000 | Freq: Every day | NASAL | 0 refills | Status: AC

## 2024-06-11 NOTE — Progress Notes (Signed)
 We are sorry that you are not feeling well.  Here is how we plan to help!  Based on what you have shared with me it looks like you have sinusitis.  Sinusitis is inflammation and infection in the sinus cavities of the head.  Based on your presentation I believe you most likely have Acute Viral Sinusitis.This is an infection most likely caused by a virus. There is not specific treatment for viral sinusitis other than to help you with the symptoms until the infection runs its course.  You may use an oral decongestant such as Mucinex D or if you have glaucoma or high blood pressure use plain Mucinex. Saline nasal spray help and can safely be used as often as needed for congestion, I have prescribed: Fluticasone  nasal spray two sprays in each nostril once a day for 10-14 days.   Some authorities believe that zinc sprays or the use of Echinacea may shorten the course of your symptoms.  Sinus infections are not as easily transmitted as other respiratory infection, however we still recommend that you avoid close contact with loved ones, especially the very young and elderly.  Remember to wash your hands thoroughly throughout the day as this is the number one way to prevent the spread of infection!  For laryngitis and hoarseness, voice rest, warm liquids, and salt water gargles are the best treatment options.   Home Care: Only take medications as instructed by your medical team. Do not take these medications with alcohol. A steam or ultrasonic humidifier can help congestion.  You can place a towel over your head and breathe in the steam from hot water coming from a faucet. Avoid close contacts especially the very young and the elderly. Cover your mouth when you cough or sneeze. Always remember to wash your hands.  Get Help Right Away If: You develop worsening fever or sinus pain. You develop a severe head ache or visual changes. Your symptoms persist after you have completed your treatment plan.  Make  sure you Understand these instructions. Will watch your condition. Will get help right away if you are not doing well or get worse.  Your e-visit answers were reviewed by a board certified advanced clinical practitioner to complete your personal care plan.  Depending on the condition, your plan could have included both over the counter or prescription medications.  If there is a problem please reply  once you have received a response from your provider.  Your safety is important to us .  If you have drug allergies check your prescription carefully.    You can use MyChart to ask questions about todays visit, request a non-urgent call back, or ask for a work or school excuse for 24 hours related to this e-Visit. If it has been greater than 24 hours you will need to follow up with your provider, or enter a new e-Visit to address those concerns.  You will get an e-mail in the next two days asking about your experience.  I hope that your e-visit has been valuable and will speed your recovery. Thank you for using e-visits.  I have spent 5 minutes in review of e-visit questionnaire, review and updating patient chart, medical decision making and response to patient.   [name], PA-C

## 2024-07-13 ENCOUNTER — Telehealth: Admitting: Family Medicine

## 2024-07-13 DIAGNOSIS — K0889 Other specified disorders of teeth and supporting structures: Secondary | ICD-10-CM

## 2024-07-13 MED ORDER — CLINDAMYCIN HCL 300 MG PO CAPS: 300.0000 mg | ORAL_CAPSULE | Freq: Three times a day (TID) | ORAL | 0 refills | Status: AC

## 2024-07-13 NOTE — Progress Notes (Signed)
 E-Visit for Dental Pain  We are sorry that you are not feeling well.  Here is how we plan to help!  Based on what you have shared with me in the questionnaire, it sounds like you have dental infection from an impacted tooth  Clindamycin  300mg  3 times a day for 7 days  It is imperative that you see a dentist within 10 days of this eVisit to determine the cause of the dental pain and be sure it is adequately treated  A toothache or tooth pain is caused when the nerve in the root of a tooth or surrounding a tooth is irritated. Dental (tooth) infection, decay, injury, or loss of a tooth are the most common causes of dental pain. Pain may also occur after an extraction (tooth is pulled out). Pain sometimes originates from other areas and radiates to the jaw, thus appearing to be tooth pain.Bacteria growing inside your mouth can contribute to gum disease and dental decay, both of which can cause pain. A toothache occurs from inflammation of the central portion of the tooth called pulp. The pulp contains nerve endings that are very sensitive to pain. Inflammation to the pulp or pulpitis may be caused by dental cavities, trauma, and infection.    HOME CARE:   For toothaches: Over-the-counter pain medications such as acetaminophen  or ibuprofen may be used. Take these as directed on the package while you arrange for a dental appointment. Avoid very cold or hot foods, because they may make the pain worse. You may get relief from biting on a cotton ball soaked in oil of cloves. You can get oil of cloves at most drug stores.  For jaw pain:  Aspirin may be helpful for problems in the joint of the jaw in adults. If pain happens every time you open your mouth widely, the temporomandibular joint (TMJ) may be the source of the pain. Yawning or taking a large bite of food may worsen the pain. An appointment with your doctor or dentist will help you find the cause.     GET HELP RIGHT AWAY IF:  You have a  high fever or chills If you have had a recent head or face injury and develop headache, light headedness, nausea, vomiting, or other symptoms that concern you after an injury to your face or mouth, you could have a more serious injury in addition to your dental injury. A facial rash associated with a toothache: This condition may improve with medication. Contact your doctor for them to decide what is appropriate. Any jaw pain occurring with chest pain: Although jaw pain is most commonly caused by dental disease, it is sometimes referred pain from other areas. People with heart disease, especially people who have had stents placed, people with diabetes, or those who have had heart surgery may have jaw pain as a symptom of heart attack or angina. If your jaw or tooth pain is associated with lightheadedness, sweating, or shortness of breath, you should see a doctor as soon as possible. Trouble swallowing or excessive pain or bleeding from gums: If you have a history of a weakened immune system, diabetes, or steroid use, you may be more susceptible to infections. Infections can often be more severe and extensive or caused by unusual organisms. Dental and gum infections in people with these conditions may require more aggressive treatment. An abscess may need draining or IV antibiotics, for example.  MAKE SURE YOU   Understand these instructions. Will watch your condition. Will get help right  away if you are not doing well or get worse.  Thank you for choosing an e-visit.  Your e-visit answers were reviewed by a board certified advanced clinical practitioner to complete your personal care plan. Depending upon the condition, your plan could have included both over the counter or prescription medications.  Please review your pharmacy choice. Make sure the pharmacy is open so you can pick up prescription now. If there is a problem, you may contact your provider through [employer] and have the  prescription routed to another pharmacy.  Your safety is important to us . If you have drug allergies check your prescription carefully.   For the next 24 hours you can use MyChart to ask questions about today's visit, request a non-urgent call back, or ask for a work or school excuse. You will get an email in the next two days asking about your experience. I hope that your e-visit has been valuable and will speed your recovery.  I have spent 5 minutes in review of e-visit questionnaire, review and updating patient chart, medical decision making and response to patient.   Chiquita CHRISTELLA Barefoot, NP
